# Patient Record
Sex: Female | Born: 1980
Health system: Southern US, Community
[De-identification: ages and names within clinical notes are randomized; demographics above are authoritative.]

## PROBLEM LIST (undated history)

## (undated) DIAGNOSIS — N809 Endometriosis, unspecified: Secondary | ICD-10-CM

## (undated) DIAGNOSIS — O09519 Supervision of elderly primigravida, unspecified trimester: Secondary | ICD-10-CM

## (undated) DIAGNOSIS — I10 Essential (primary) hypertension: Secondary | ICD-10-CM

## (undated) HISTORY — PX: APPENDECTOMY: SHX54

## (undated) HISTORY — DX: Endometriosis, unspecified: N80.9

## (undated) HISTORY — PX: WISDOM TOOTH EXTRACTION: SHX21

## (undated) HISTORY — PX: ABLATION ON ENDOMETRIOSIS: SHX5787

## (undated) HISTORY — PX: OTHER SURGICAL HISTORY: SHX169

## (undated) HISTORY — PX: ENDOMETRIAL ABLATION: SHX621

---

## 2011-10-25 DIAGNOSIS — G43009 Migraine without aura, not intractable, without status migrainosus: Secondary | ICD-10-CM | POA: Insufficient documentation

## 2011-10-25 DIAGNOSIS — I10 Essential (primary) hypertension: Secondary | ICD-10-CM | POA: Insufficient documentation

## 2011-10-25 DIAGNOSIS — K589 Irritable bowel syndrome without diarrhea: Secondary | ICD-10-CM | POA: Insufficient documentation

## 2013-07-11 NOTE — L&D Delivery Note (Signed)
Delivery Note At 1:19 PM a viable and healthy female was delivered via  (Presentation: LOA  ).  APGAR: 9, 9; weight pending.   Placenta status: spontaneous, intact.  Cord:  with the following complications: none.  Cord pH: na  Anesthesia:epidural   Episiotomy: none Lacerations: second Suture Repair: 2.0 vicryl rapide Est. Blood Loss (mL):   Mom to postpartum.  Baby to Couplet care / Skin to Skin.  Trinitey Roache J 04/23/2014, 1:34 PM

## 2013-09-20 LAB — OB RESULTS CONSOLE HIV ANTIBODY (ROUTINE TESTING): HIV: NONREACTIVE

## 2013-09-20 LAB — OB RESULTS CONSOLE ANTIBODY SCREEN: Antibody Screen: NEGATIVE

## 2013-09-20 LAB — OB RESULTS CONSOLE RPR: RPR: NONREACTIVE

## 2013-09-20 LAB — OB RESULTS CONSOLE RUBELLA ANTIBODY, IGM: Rubella: IMMUNE

## 2013-09-20 LAB — OB RESULTS CONSOLE HEPATITIS B SURFACE ANTIGEN: Hepatitis B Surface Ag: NEGATIVE

## 2013-09-20 LAB — OB RESULTS CONSOLE ABO/RH: RH Type: POSITIVE

## 2013-09-23 ENCOUNTER — Other Ambulatory Visit: Payer: Self-pay

## 2013-09-24 LAB — OB RESULTS CONSOLE GC/CHLAMYDIA
CHLAMYDIA, DNA PROBE: NEGATIVE
Gonorrhea: NEGATIVE

## 2013-11-25 ENCOUNTER — Other Ambulatory Visit (HOSPITAL_COMMUNITY): Payer: Self-pay | Admitting: Obstetrics & Gynecology

## 2013-11-25 DIAGNOSIS — O358XX0 Maternal care for other (suspected) fetal abnormality and damage, not applicable or unspecified: Secondary | ICD-10-CM

## 2013-11-27 ENCOUNTER — Ambulatory Visit (HOSPITAL_COMMUNITY)
Admission: RE | Admit: 2013-11-27 | Discharge: 2013-11-27 | Disposition: A | Discharge status: Home / Self Care | Payer: PRIVATE HEALTH INSURANCE | Source: Ambulatory Visit | Attending: Obstetrics & Gynecology | Admitting: Obstetrics & Gynecology

## 2013-11-27 ENCOUNTER — Other Ambulatory Visit (HOSPITAL_COMMUNITY): Payer: Self-pay | Admitting: Obstetrics & Gynecology

## 2013-11-27 ENCOUNTER — Other Ambulatory Visit: Payer: Self-pay

## 2013-11-27 ENCOUNTER — Encounter (HOSPITAL_COMMUNITY): Payer: Self-pay

## 2013-11-27 DIAGNOSIS — O352XX0 Maternal care for (suspected) hereditary disease in fetus, not applicable or unspecified: Secondary | ICD-10-CM | POA: Insufficient documentation

## 2013-11-27 DIAGNOSIS — O358XX0 Maternal care for other (suspected) fetal abnormality and damage, not applicable or unspecified: Secondary | ICD-10-CM

## 2013-11-27 DIAGNOSIS — IMO0002 Reserved for concepts with insufficient information to code with codable children: Secondary | ICD-10-CM | POA: Insufficient documentation

## 2013-11-27 DIAGNOSIS — O289 Unspecified abnormal findings on antenatal screening of mother: Secondary | ICD-10-CM

## 2013-11-27 HISTORY — DX: Essential (primary) hypertension: I10

## 2013-11-27 HISTORY — DX: Supervision of elderly primigravida, unspecified trimester: O09.519

## 2013-11-27 NOTE — Progress Notes (Signed)
Genetic Counseling  High-Risk Gestation Note  Appointment Date:  11/27/2013 Referred By: Anna Royals, MD Date of Birth:  February 02, 1981 Partner: Anna Washington   Pregnancy History: G1P0 Estimated Date of Delivery: 04/19/14 Estimated Gestational Age: [redacted]w[redacted]d Attending: Benjaman Lobe, MD   [name] and her husband, [name], were seen for genetic counseling regarding a concern for a fetal disorder of sex development.    [name] had noninvasive prenatal screening (NIPS)/cell free fetal DNA (cffDNA) testing, specifically InformaSeq testing, through [facility] because of a maternal age of 59 (37 at delivery).  This result revealed "no aneuploidy detected" for chromosomes 21, 13, and 18.  The fetal gender was predicted to be female, indicating that fetal Y chromosome material was detected.  A detailed anatomy ultrasound was performed at [facility] on Nov 25, 2013.  By ultrasound, the fetus appears to be female.  The patient was referred to the [facility] for a repeat ultrasound, consultation, and genetic counseling.  A complete ultrasound was performed today.  The report will be documented separately.   There were no visualized fetal anomalies or markers for aneuploidy.  The fetus appeared phenotypically female.  In reviewing the history, this pregnancy was conceived by IVF.  Two embryos were transferred.  [name] reported that a second embryonic second sac was still visualized at the time of her nuchal translucency ultrasound at [redacted]w[redacted]d gestation.   We reviewed that cell free fetal DNA is primarily derived from the placenta and that fragments of cell free fetal DNA are typically cleared quickly from maternal circulation.  However, given that a second embryonic sac was still visualized two weeks after the InformaSeq testing was performed, it is very likely that the discrepant results between genetic gender and ultrasound is due  to the presence of a second, female, embryonic sac.  We discussed that gender determination by NIPS is performed by looking for the presence or absence of Y chromosome material.  Considering that [name] is currently [redacted]w[redacted]d gestation, we discussed the option of repeating NIPS.  If the Y chromosome material was truly from the second embryonic sac, which is no longer visualized, those cells should have cleared from maternal circulation and the gender by NIPS should now be female.    We also discussed the option of amniocentesis including the risks, benefits, and limitations of this procedure.  They understand that cells derived from amniocentesis can be used for fetal chromosome analysis, including sex chromosome analysis and microarray analysis.  After thoughtful consideration of this option, this couple elected to proceed with a repeat NIPS (Panorama).  Results will be available in approximately 7 days.  Amniocentesis was declined.  In addition, we discussed the possibility of a fetal disorder of sex development (DSD).  This is a term that is used to replace previously used terms such as sex reversal, pseudohermaphroditism, and intersex.  They were counseled that female sexual determination is initiated by Y chromosomal SRY, which activates a cascade of genes that lead the embryonic gonad to develop into a testis.   Significant genetic heterogeneity is observed in DSD.  Mutations in X-linked (NR0B1), Y-linked (SRY) and autosomal genes (NR5A1, CBX2, MAP3K1, DHH, AKR1C2, AKR1C4, and ZFPM2) have been linked to 22,XY males having external female genitalia.  In addition, specific deletions of chromosome material from the X or Y chromosome as well as autosomal chromosomes can cause DSD.   Given the significant genetic heterogeneity, we discussed  that it is often difficult to determine a specific cause for a prenatally detected DSD.  We discussed the availability of a consultation with pediatric subspecialists including  medical genetics, if the Panorama test again shows the presence of Y chromosome material.  [name] was provided with written information regarding cystic fibrosis (CF) including the carrier frequency and incidence in the Caucasian population, the availability of carrier testing and prenatal diagnosis if indicated.  In addition, we discussed that CF is routinely screened for as part of the [place] newborn screening panel.  She reported that she had this screening previously, and that the results were negative for the common mutations analyzed.    In addition, [name] reported that her father has some Jewish ancestry.  She is uncertain if he is Ashkenazi [place] or Sephardic Jewish.  We discussed the availability of carrier screening for certain genetic conditions based on Jewish ancestry.  Genetic testing is available for some of the more common conditions, including Canavan disease, cystic fibrosis, Tay sachs disease, Gaucher disease, Mucolipidosis type IV,  Neimann-Pick type A, Fanconi anemia type C, Bloom syndrome, and familial dysautonomia.  All of these conditions are inherited in an autosomal recessive fashion, where both parents have to be carriers of the condition before a baby is at increased risk (1 in 4, or 25% risk) to inherit the disease. Carrier screening performed through a standard blood draw can reduce but not eliminate a person's chance to be a carrier for these conditions.  [name] declined Ashkenazi Jewish ancestry based carrier screening at the time of today's visit.   Both family histories were reviewed and found to be contributory for Anna Washington having a nephew who has congenital hearing loss of unknown etiology.  We reviewed that hearing loss can be environmental, multifactorial, or genetic.  Genetic causes can be subdivided into syndromic and nonsyndromic categories.  We reviewed that most nonsyndromic genetic hearing loss is inherited in an autosomal recessive manner.  We  reviewed recessive inheritance and associated risks of recurrence.  Without further information regarding the cause of this relative's hearing loss, an accurate risk assessment cannot be provided.  Further genetic counseling is warranted if additional information is learned.  We also discussed the availability of newborn screening for hearing loss.  I counseled this couple regarding the above risks and available options.  The approximate face-to-face time with the genetic counselor was 42 minutes.  Sharyne Richters, MS Certified Genetic Counselor

## 2013-12-04 ENCOUNTER — Telehealth (HOSPITAL_COMMUNITY): Payer: Self-pay

## 2013-12-04 NOTE — Telephone Encounter (Signed)
Called Anna Washington to discuss her cell free fetal DNA test results.  [name] had Panorama testing through [facility].  Testing was offered because the fetal sex was discrepant between InformaSeq results (performed at the referring provider's office) and ultrasound. The patient was identified by name and DOB.  We reviewed that these are within normal limits, showing a less than 1 in 10,000 risk for trisomies 21, 18 and 13, and monosomy X (Turner syndrome).  In addition, the risk for triploidy/vanishing twin and sex chromosome trisomies (47,XXX and 47,XXY) was also low risk.   We reviewed that this testing identifies > 99% of pregnancies with trisomy 105, trisomy 66, sex chromosome trisomies (47,XXX and 47,XXY), and triploidy. The detection rate for trisomy 18 is 96%.  The detection rate for monosomy X is ~92%.  The false positive rate is <0.1% for all conditions. Testing was also consistent with female fetal sex.  We discussed that this is consistent with her ultrasound images.  Discussed that the female fetal sex InformaSeq result was likely due to the presence of a second gestational sac at the time that the testing was performed. She understands that this testing does not identify all genetic conditions.  All questions were answered to her satisfaction, she was encouraged to call with additional questions or concerns.  Sharyne Richters, MS Certified Genetic Counselor

## 2013-12-23 ENCOUNTER — Inpatient Hospital Stay (HOSPITAL_COMMUNITY): Admission: AD | Admit: 2013-12-23 | Payer: Self-pay | Source: Ambulatory Visit | Admitting: Obstetrics & Gynecology

## 2014-04-03 LAB — OB RESULTS CONSOLE GBS: GBS: NEGATIVE

## 2014-04-16 ENCOUNTER — Telehealth (HOSPITAL_COMMUNITY): Payer: Self-pay | Admitting: *Deleted

## 2014-04-16 ENCOUNTER — Encounter (HOSPITAL_COMMUNITY): Payer: Self-pay | Admitting: *Deleted

## 2014-04-16 NOTE — Telephone Encounter (Signed)
Preadmission screen  

## 2014-04-22 ENCOUNTER — Other Ambulatory Visit: Payer: PRIVATE HEALTH INSURANCE | Admitting: Obstetrics and Gynecology

## 2014-04-22 ENCOUNTER — Encounter (HOSPITAL_COMMUNITY): Payer: Self-pay

## 2014-04-22 ENCOUNTER — Inpatient Hospital Stay (HOSPITAL_COMMUNITY)
Admission: RE | Admit: 2014-04-22 | Discharge: 2014-04-26 | DRG: 768 | Disposition: A | Discharge status: Home / Self Care | Payer: PRIVATE HEALTH INSURANCE | Source: Ambulatory Visit | Attending: Obstetrics and Gynecology | Admitting: Obstetrics and Gynecology

## 2014-04-22 DIAGNOSIS — O09513 Supervision of elderly primigravida, third trimester: Secondary | ICD-10-CM | POA: Diagnosis not present

## 2014-04-22 DIAGNOSIS — O4100X0 Oligohydramnios, unspecified trimester, not applicable or unspecified: Secondary | ICD-10-CM | POA: Diagnosis present

## 2014-04-22 DIAGNOSIS — Z3A40 40 weeks gestation of pregnancy: Secondary | ICD-10-CM | POA: Diagnosis present

## 2014-04-22 DIAGNOSIS — Z8249 Family history of ischemic heart disease and other diseases of the circulatory system: Secondary | ICD-10-CM

## 2014-04-22 DIAGNOSIS — IMO0001 Reserved for inherently not codable concepts without codable children: Secondary | ICD-10-CM

## 2014-04-22 DIAGNOSIS — O480 Post-term pregnancy: Principal | ICD-10-CM | POA: Diagnosis present

## 2014-04-22 DIAGNOSIS — O1002 Pre-existing essential hypertension complicating childbirth: Secondary | ICD-10-CM | POA: Diagnosis present

## 2014-04-22 DIAGNOSIS — O9902 Anemia complicating childbirth: Secondary | ICD-10-CM | POA: Diagnosis present

## 2014-04-22 DIAGNOSIS — O4103X0 Oligohydramnios, third trimester, not applicable or unspecified: Secondary | ICD-10-CM | POA: Diagnosis present

## 2014-04-22 DIAGNOSIS — D62 Acute posthemorrhagic anemia: Secondary | ICD-10-CM | POA: Diagnosis present

## 2014-04-22 DIAGNOSIS — Z833 Family history of diabetes mellitus: Secondary | ICD-10-CM | POA: Diagnosis not present

## 2014-04-22 DIAGNOSIS — O119 Pre-existing hypertension with pre-eclampsia, unspecified trimester: Secondary | ICD-10-CM | POA: Diagnosis present

## 2014-04-22 DIAGNOSIS — N939 Abnormal uterine and vaginal bleeding, unspecified: Secondary | ICD-10-CM

## 2014-04-22 MED ORDER — OXYCODONE-ACETAMINOPHEN 5-325 MG PO TABS: 2.0000 | ORAL_TABLET | ORAL | Status: DC | PRN

## 2014-04-22 MED ORDER — CITRIC ACID-SODIUM CITRATE 334-500 MG/5ML PO SOLN: 30.0000 mL | ORAL | Status: DC | PRN

## 2014-04-22 MED ORDER — ONDANSETRON HCL 4 MG/2ML IJ SOLN: 4.0000 mg | Freq: Four times a day (QID) | INTRAMUSCULAR | Status: DC | PRN

## 2014-04-22 MED ORDER — OXYTOCIN 40 UNITS IN LACTATED RINGERS INFUSION - SIMPLE MED
62.5000 mL/h | INTRAVENOUS | Status: DC
  Administered 2014-04-23: 125 mL/h via INTRAVENOUS
  Filled 2014-04-22 (×2): qty 1000

## 2014-04-22 MED ORDER — OXYTOCIN 40 UNITS IN LACTATED RINGERS INFUSION - SIMPLE MED
1.0000 m[IU]/min | INTRAVENOUS | Status: DC
  Administered 2014-04-23: 150 mL/h via INTRAVENOUS

## 2014-04-22 MED ORDER — LIDOCAINE HCL (PF) 1 % IJ SOLN
30.0000 mL | INTRAMUSCULAR | Status: DC | PRN
  Filled 2014-04-22: qty 30

## 2014-04-22 MED ORDER — OXYCODONE-ACETAMINOPHEN 5-325 MG PO TABS: 1.0000 | ORAL_TABLET | ORAL | Status: DC | PRN

## 2014-04-22 MED ORDER — ACETAMINOPHEN 325 MG PO TABS
650.0000 mg | ORAL_TABLET | ORAL | Status: DC | PRN
  Filled 2014-04-22 (×2): qty 2

## 2014-04-22 MED ORDER — LACTATED RINGERS IV SOLN
500.0000 mL | INTRAVENOUS | Status: DC | PRN
  Administered 2014-04-23: 1000 mL via INTRAVENOUS

## 2014-04-22 MED ORDER — TERBUTALINE SULFATE 1 MG/ML IJ SOLN: 0.2500 mg | Freq: Once | INTRAMUSCULAR | Status: AC | PRN

## 2014-04-22 MED ORDER — MISOPROSTOL 25 MCG QUARTER TABLET
25.0000 ug | ORAL_TABLET | ORAL | Status: DC | PRN
  Administered 2014-04-22 – 2014-04-23 (×2): 25 ug via VAGINAL
  Filled 2014-04-22 (×2): qty 0.25

## 2014-04-22 MED ORDER — LACTATED RINGERS IV SOLN
INTRAVENOUS | Status: DC
  Administered 2014-04-22 – 2014-04-23 (×3): via INTRAVENOUS
  Administered 2014-04-23: 1000 mL via INTRAVENOUS
  Administered 2014-04-23: 17:00:00 via INTRAVENOUS

## 2014-04-22 MED ORDER — OXYTOCIN BOLUS FROM INFUSION
500.0000 mL | INTRAVENOUS | Status: DC
  Administered 2014-04-23: 500 mL via INTRAVENOUS

## 2014-04-22 MED ORDER — FLEET ENEMA 7-19 GM/118ML RE ENEM: 1.0000 | ENEMA | RECTAL | Status: DC | PRN

## 2014-04-22 MED ORDER — ZOLPIDEM TARTRATE 5 MG PO TABS: 5.0000 mg | ORAL_TABLET | Freq: Every evening | ORAL | Status: DC | PRN

## 2014-04-22 NOTE — H&P (Signed)
Anna Washington is a 33 y.o. female presenting for IOL for postterm, CHTN and oligo.  Maternal Medical History:  Contractions: Onset was less than 1 hour ago.   Frequency: irregular.   Perceived severity is mild.    Fetal activity: Perceived fetal activity is normal.   Last perceived fetal movement was within the past hour.    Prenatal complications: PIH and oligohydramnios.   Prenatal Complications - Diabetes: none.    OB History   Grav Para Term Preterm Abortions TAB SAB Ect Mult Living   1              Past Medical History  Diagnosis Date  . Hypertension   . Newborn product of IVF pregnancy   . AMA (advanced maternal age) primigravida 69+   . Endometriosis    No past surgical history on file. Family History: family history includes Cancer in her maternal grandmother and paternal grandfather; Diabetes in her paternal grandfather and paternal grandmother; Heart attack in her maternal grandfather. Social History:  has no tobacco, alcohol, and drug history on file.   Prenatal Transfer Tool  Maternal Diabetes: No Genetic Screening: Normal Maternal Ultrasounds/Referrals: Normal Fetal Ultrasounds or other Referrals:  None Maternal Substance Abuse:  No Significant Maternal Medications:  None Significant Maternal Lab Results:  None Other Comments:  CHTN on no meds, Oligo  Review of Systems  Constitutional: Negative.   Respiratory: Negative.   Gastrointestinal: Negative.   Genitourinary: Negative.   Neurological: Negative.   All other systems reviewed and are negative.     Last menstrual period 07/07/2013. Maternal Exam:  Uterine Assessment: Contraction strength is mild.  Contraction frequency is rare.   Abdomen: Patient reports no abdominal tenderness. Fetal presentation: vertex  Introitus: Normal vulva. Normal vagina.  Pelvis: adequate for delivery.   Cervix: Cervix evaluated by digital exam.     Physical Exam  Constitutional: She is oriented to person, place,  and time. She appears well-developed and well-nourished.  HENT:  Head: Normocephalic and atraumatic.  Neck: Normal range of motion. Neck supple.  Cardiovascular: Normal rate and regular rhythm.   Respiratory: Effort normal and breath sounds normal.  GI: Soft. Bowel sounds are normal.  Genitourinary: Vagina normal and uterus normal.  Musculoskeletal: Normal range of motion.  Neurological: She is alert and oriented to person, place, and time. She has normal reflexes.  Skin: Skin is warm.  Psychiatric: She has a normal mood and affect.    Prenatal labs: ABO, Rh: O/Positive/-- (03/13 0000) Antibody: Negative (03/13 0000) Rubella: Immune (03/13 0000) RPR: Nonreactive (03/13 0000)  HBsAg: Negative (03/13 0000)  HIV: Non-reactive (03/13 0000)  GBS: Negative (09/24 0000)   Assessment/Plan: 40+ weeks CHTN on no meds New onset Oligo Cytotec, Pitocin Epidural prn   Cherise Fedder J 04/22/2014, 11:20 PM

## 2014-04-23 ENCOUNTER — Inpatient Hospital Stay (HOSPITAL_COMMUNITY): Payer: PRIVATE HEALTH INSURANCE | Admitting: Anesthesiology

## 2014-04-23 ENCOUNTER — Encounter (HOSPITAL_COMMUNITY): Payer: Self-pay

## 2014-04-23 ENCOUNTER — Encounter (HOSPITAL_COMMUNITY)
Admission: RE | Disposition: A | Discharge status: Home / Self Care | Payer: Self-pay | Source: Ambulatory Visit | Attending: Obstetrics and Gynecology

## 2014-04-23 ENCOUNTER — Encounter (HOSPITAL_COMMUNITY): Payer: PRIVATE HEALTH INSURANCE | Admitting: Anesthesiology

## 2014-04-23 ENCOUNTER — Inpatient Hospital Stay (HOSPITAL_COMMUNITY): Payer: PRIVATE HEALTH INSURANCE

## 2014-04-23 HISTORY — PX: DILATION AND EVACUATION: SHX1459

## 2014-04-23 HISTORY — PX: EXAMINATION UNDER ANESTHESIA: SHX1540

## 2014-04-23 LAB — CBC
HCT: 35.6 % — ABNORMAL LOW (ref 36.0–46.0)
HCT: 35.7 % — ABNORMAL LOW (ref 36.0–46.0)
HCT: 36.3 % (ref 36.0–46.0)
HEMATOCRIT: 36.8 % (ref 36.0–46.0)
HEMOGLOBIN: 12.4 g/dL (ref 12.0–15.0)
HEMOGLOBIN: 12.8 g/dL (ref 12.0–15.0)
Hemoglobin: 12.4 g/dL (ref 12.0–15.0)
Hemoglobin: 12.9 g/dL (ref 12.0–15.0)
MCH: 31.1 pg (ref 26.0–34.0)
MCH: 32.1 pg (ref 26.0–34.0)
MCH: 32.3 pg (ref 26.0–34.0)
MCH: 32.8 pg (ref 26.0–34.0)
MCHC: 34.7 g/dL (ref 30.0–36.0)
MCHC: 34.8 g/dL (ref 30.0–36.0)
MCHC: 35.1 g/dL (ref 30.0–36.0)
MCHC: 35.3 g/dL (ref 30.0–36.0)
MCV: 88.7 fL (ref 78.0–100.0)
MCV: 92.5 fL (ref 78.0–100.0)
MCV: 92.7 fL (ref 78.0–100.0)
MCV: 93.1 fL (ref 78.0–100.0)
PLATELETS: 117 10*3/uL — AB (ref 150–400)
PLATELETS: 186 10*3/uL (ref 150–400)
Platelets: 165 10*3/uL (ref 150–400)
Platelets: 156 10*3/uL (ref 150–400)
RBC: 3.84 MIL/uL — ABNORMAL LOW (ref 3.87–5.11)
RBC: 3.86 MIL/uL — ABNORMAL LOW (ref 3.87–5.11)
RBC: 3.9 MIL/uL (ref 3.87–5.11)
RBC: 4.15 MIL/uL (ref 3.87–5.11)
RDW: 12.8 % (ref 11.5–15.5)
RDW: 12.8 % (ref 11.5–15.5)
RDW: 12.9 % (ref 11.5–15.5)
RDW: 14.3 % (ref 11.5–15.5)
WBC: 11.2 10*3/uL — ABNORMAL HIGH (ref 4.0–10.5)
WBC: 11.9 10*3/uL — ABNORMAL HIGH (ref 4.0–10.5)
WBC: 13.9 10*3/uL — AB (ref 4.0–10.5)
WBC: 9.3 10*3/uL (ref 4.0–10.5)

## 2014-04-23 LAB — COMPREHENSIVE METABOLIC PANEL
ALBUMIN: 2.8 g/dL — AB (ref 3.5–5.2)
ALBUMIN: 2.9 g/dL — AB (ref 3.5–5.2)
ALK PHOS: 101 U/L (ref 39–117)
ALK PHOS: 101 U/L (ref 39–117)
ALT: 16 U/L (ref 0–35)
ALT: 16 U/L (ref 0–35)
ANION GAP: 11 (ref 5–15)
AST: 25 U/L (ref 0–37)
AST: 26 U/L (ref 0–37)
Anion gap: 13 (ref 5–15)
BUN: 12 mg/dL (ref 6–23)
BUN: 8 mg/dL (ref 6–23)
CALCIUM: 8.7 mg/dL (ref 8.4–10.5)
CO2: 19 mEq/L (ref 19–32)
CO2: 22 mEq/L (ref 19–32)
Calcium: 8.2 mg/dL — ABNORMAL LOW (ref 8.4–10.5)
Chloride: 101 mEq/L (ref 96–112)
Chloride: 103 mEq/L (ref 96–112)
Creatinine, Ser: 0.63 mg/dL (ref 0.50–1.10)
Creatinine, Ser: 0.63 mg/dL (ref 0.50–1.10)
GFR calc Af Amer: >90 mL/min (ref >90)
GFR calc Af Amer: >90 mL/min (ref >90)
GFR calc non Af Amer: >90 mL/min (ref >90)
GFR calc non Af Amer: >90 mL/min (ref >90)
Glucose, Bld: 88 mg/dL (ref 70–99)
Glucose, Bld: 93 mg/dL (ref 70–99)
POTASSIUM: 3.8 meq/L (ref 3.7–5.3)
Potassium: 4.2 mEq/L (ref 3.7–5.3)
Sodium: 134 mEq/L — ABNORMAL LOW (ref 137–147)
Sodium: 135 mEq/L — ABNORMAL LOW (ref 137–147)
Total Bilirubin: 0.3 mg/dL (ref 0.3–1.2)
Total Bilirubin: <0.2 mg/dL — ABNORMAL LOW (ref 0.3–1.2)
Total Protein: 6 g/dL (ref 6.0–8.3)
Total Protein: 6 g/dL (ref 6.0–8.3)

## 2014-04-23 LAB — LACTATE DEHYDROGENASE: LDH: 186 U/L (ref 94–250)

## 2014-04-23 LAB — PROTIME-INR
INR: 1.23 (ref 0.00–1.49)
Prothrombin Time: 15.7 seconds — ABNORMAL HIGH (ref 11.6–15.2)

## 2014-04-23 LAB — PROTEIN / CREATININE RATIO, URINE
Creatinine, Urine: 70.21 mg/dL
PROTEIN CREATININE RATIO: 0.44 — AB (ref 0.00–0.15)
TOTAL PROTEIN, URINE: 30.9 mg/dL

## 2014-04-23 LAB — APTT: aPTT: 29 seconds (ref 24–37)

## 2014-04-23 LAB — PREPARE RBC (CROSSMATCH)

## 2014-04-23 LAB — RPR

## 2014-04-23 LAB — URIC ACID: Uric Acid, Serum: 4.7 mg/dL (ref 2.4–7.0)

## 2014-04-23 LAB — FIBRINOGEN: Fibrinogen: 167 mg/dL — ABNORMAL LOW (ref 204–475)

## 2014-04-23 LAB — ABO/RH: ABO/RH(D): O POS

## 2014-04-23 SURGERY — EXAM UNDER ANESTHESIA
Anesthesia: Epidural | Site: Vagina

## 2014-04-23 MED ORDER — SODIUM CHLORIDE 0.9 % IV SOLN: Freq: Once | INTRAVENOUS | Status: DC

## 2014-04-23 MED ORDER — FENTANYL CITRATE 0.05 MG/ML IJ SOLN
INTRAMUSCULAR | Status: AC
  Filled 2014-04-23: qty 2

## 2014-04-23 MED ORDER — MAGNESIUM SULFATE 40 G IN LACTATED RINGERS - SIMPLE
2.0000 g/h | INTRAVENOUS | Status: DC
  Filled 2014-04-23: qty 500

## 2014-04-23 MED ORDER — SODIUM BICARBONATE 8.4 % IV SOLN
INTRAVENOUS | Status: DC | PRN
  Administered 2014-04-23 (×3): 5 mL via EPIDURAL

## 2014-04-23 MED ORDER — MORPHINE SULFATE 0.5 MG/ML IJ SOLN
INTRAMUSCULAR | Status: AC
  Filled 2014-04-23: qty 10

## 2014-04-23 MED ORDER — DIPHENHYDRAMINE HCL 50 MG/ML IJ SOLN: 25.0000 mg | Freq: Once | INTRAMUSCULAR | Status: DC

## 2014-04-23 MED ORDER — MORPHINE SULFATE (PF) 0.5 MG/ML IJ SOLN
INTRAMUSCULAR | Status: DC | PRN
  Administered 2014-04-23: 1 mg via INTRAVENOUS
  Administered 2014-04-23: 4 mg via EPIDURAL

## 2014-04-23 MED ORDER — SODIUM BICARBONATE 8.4 % IV SOLN
INTRAVENOUS | Status: AC
  Filled 2014-04-23: qty 50

## 2014-04-23 MED ORDER — FENTANYL CITRATE 0.05 MG/ML IJ SOLN: 25.0000 ug | INTRAMUSCULAR | Status: DC | PRN

## 2014-04-23 MED ORDER — PHENYLEPHRINE 40 MCG/ML (10ML) SYRINGE FOR IV PUSH (FOR BLOOD PRESSURE SUPPORT)
PREFILLED_SYRINGE | INTRAVENOUS | Status: AC
  Filled 2014-04-23: qty 10

## 2014-04-23 MED ORDER — MEPERIDINE HCL 25 MG/ML IJ SOLN: 6.2500 mg | INTRAMUSCULAR | Status: DC | PRN

## 2014-04-23 MED ORDER — OXYTOCIN 40 UNITS IN LACTATED RINGERS INFUSION - SIMPLE MED
100.0000 mL/h | INTRAVENOUS | Status: DC
  Administered 2014-04-23 – 2014-04-24 (×3): 100 mL/h via INTRAVENOUS
  Filled 2014-04-23 (×2): qty 1000

## 2014-04-23 MED ORDER — CARBOPROST TROMETHAMINE 250 MCG/ML IM SOLN: 250.0000 ug | INTRAMUSCULAR | Status: DC | PRN

## 2014-04-23 MED ORDER — MISOPROSTOL 200 MCG PO TABS
ORAL_TABLET | ORAL | Status: AC
Start: 2014-04-23 — End: 2014-04-24
  Filled 2014-04-23: qty 1

## 2014-04-23 MED ORDER — EPHEDRINE 5 MG/ML INJ: 10.0000 mg | INTRAVENOUS | Status: DC | PRN

## 2014-04-23 MED ORDER — LABETALOL HCL 5 MG/ML IV SOLN
20.0000 mg | Freq: Once | INTRAVENOUS | Status: AC
  Administered 2014-04-23: 20 mg via INTRAVENOUS
  Filled 2014-04-23: qty 4

## 2014-04-23 MED ORDER — PHENYLEPHRINE HCL 10 MG/ML IJ SOLN
INTRAMUSCULAR | Status: DC | PRN
  Administered 2014-04-23: 20 ug via INTRAVENOUS
  Administered 2014-04-23 (×2): 40 ug via INTRAVENOUS

## 2014-04-23 MED ORDER — FENTANYL 2.5 MCG/ML BUPIVACAINE 1/10 % EPIDURAL INFUSION (WH - ANES)
14.0000 mL/h | INTRAMUSCULAR | Status: DC | PRN
  Administered 2014-04-23: 14 mL/h via EPIDURAL

## 2014-04-23 MED ORDER — PHENYLEPHRINE 40 MCG/ML (10ML) SYRINGE FOR IV PUSH (FOR BLOOD PRESSURE SUPPORT)
80.0000 ug | PREFILLED_SYRINGE | INTRAVENOUS | Status: DC | PRN
  Filled 2014-04-23: qty 10

## 2014-04-23 MED ORDER — LIDOCAINE-EPINEPHRINE (PF) 2 %-1:200000 IJ SOLN
INTRAMUSCULAR | Status: AC
  Filled 2014-04-23: qty 20

## 2014-04-23 MED ORDER — CEFAZOLIN SODIUM-DEXTROSE 2-3 GM-% IV SOLR
INTRAVENOUS | Status: DC | PRN
  Administered 2014-04-23: 2 g via INTRAVENOUS

## 2014-04-23 MED ORDER — ACETAMINOPHEN 650 MG RE SUPP
650.0000 mg | RECTAL | Status: DC | PRN
  Filled 2014-04-23: qty 1

## 2014-04-23 MED ORDER — MIDAZOLAM HCL 2 MG/2ML IJ SOLN: 0.5000 mg | Freq: Once | INTRAMUSCULAR | Status: DC | PRN

## 2014-04-23 MED ORDER — DIPHENHYDRAMINE HCL 50 MG/ML IJ SOLN
12.5000 mg | INTRAMUSCULAR | Status: DC | PRN
  Filled 2014-04-23: qty 1

## 2014-04-23 MED ORDER — MAGNESIUM SULFATE BOLUS VIA INFUSION
4.0000 g | Freq: Once | INTRAVENOUS | Status: DC
  Filled 2014-04-23: qty 500

## 2014-04-23 MED ORDER — SODIUM CHLORIDE 0.9 % IV SOLN
INTRAVENOUS | Status: DC | PRN
  Administered 2014-04-23 (×2): via INTRAVENOUS

## 2014-04-23 MED ORDER — LACTATED RINGERS IV SOLN: 500.0000 mL | Freq: Once | INTRAVENOUS | Status: DC

## 2014-04-23 MED ORDER — PROMETHAZINE HCL 25 MG/ML IJ SOLN: 6.2500 mg | INTRAMUSCULAR | Status: DC | PRN

## 2014-04-23 MED ORDER — FENTANYL 2.5 MCG/ML BUPIVACAINE 1/10 % EPIDURAL INFUSION (WH - ANES)
INTRAMUSCULAR | Status: AC
  Administered 2014-04-23: 09:00:00
  Filled 2014-04-23: qty 125

## 2014-04-23 MED ORDER — LIDOCAINE HCL (PF) 1 % IJ SOLN
INTRAMUSCULAR | Status: DC | PRN
  Administered 2014-04-23 (×4): 4 mL

## 2014-04-23 MED ORDER — MIDAZOLAM HCL 2 MG/2ML IJ SOLN
INTRAMUSCULAR | Status: AC
  Filled 2014-04-23: qty 2

## 2014-04-23 MED ORDER — CEFOTETAN DISODIUM 2 G IJ SOLR
2.0000 g | Freq: Two times a day (BID) | INTRAMUSCULAR | Status: DC
  Administered 2014-04-23 – 2014-04-24 (×2): 2 g via INTRAVENOUS
  Filled 2014-04-23 (×3): qty 2

## 2014-04-23 MED ORDER — PHENYLEPHRINE 40 MCG/ML (10ML) SYRINGE FOR IV PUSH (FOR BLOOD PRESSURE SUPPORT)
80.0000 ug | PREFILLED_SYRINGE | INTRAVENOUS | Status: DC | PRN
  Administered 2014-04-23: 80 ug via INTRAVENOUS

## 2014-04-23 MED ORDER — MISOPROSTOL 200 MCG PO TABS
ORAL_TABLET | ORAL | Status: AC
  Filled 2014-04-23: qty 4

## 2014-04-23 MED ORDER — MISOPROSTOL 200 MCG PO TABS
800.0000 ug | ORAL_TABLET | Freq: Once | ORAL | Status: AC
  Administered 2014-04-23: 800 ug via RECTAL

## 2014-04-23 MED ORDER — NITROGLYCERIN 0.4 MG/SPRAY TL SOLN
Status: AC
  Filled 2014-04-23: qty 4.9

## 2014-04-23 SURGICAL SUPPLY — 20 items
ADAPTER VACURETTE TBG SET 14 (CANNULA) ×2 IMPLANT
BALLN POSTPARTUM SOS BAKRI (BALLOONS) ×2
BALLOON POSTPARTUM SOS BAKRI (BALLOONS) ×1 IMPLANT
CATH ROBINSON RED A/P 16FR (CATHETERS) ×2 IMPLANT
CLOTH BEACON ORANGE TIMEOUT ST (SAFETY) ×2 IMPLANT
GAUZE PACKING 2X5 YD STRL (GAUZE/BANDAGES/DRESSINGS) ×2 IMPLANT
GLOVE BIO SURGEON STRL SZ7.5 (GLOVE) ×2 IMPLANT
GOWN STRL REUS W/TWL LRG LVL3 (GOWN DISPOSABLE) ×4 IMPLANT
KIT BERKELEY 1ST TRIMESTER 3/8 (MISCELLANEOUS) ×2 IMPLANT
NS IRRIG 1000ML POUR BTL (IV SOLUTION) ×2 IMPLANT
PACK VAGINAL MINOR WOMEN LF (CUSTOM PROCEDURE TRAY) ×2 IMPLANT
PAD OB MATERNITY 4.3X12.25 (PERSONAL CARE ITEMS) ×2 IMPLANT
PAD PREP 24X48 CUFFED NSTRL (MISCELLANEOUS) ×2 IMPLANT
SET BERKELEY SUCTION TUBING (SUCTIONS) ×2 IMPLANT
SPONGE LAP 18X18 X RAY DECT (DISPOSABLE) ×2 IMPLANT
SUT VIC AB 2-0 CT1 27 (SUTURE) ×1
SUT VIC AB 2-0 CT1 TAPERPNT 27 (SUTURE) ×1 IMPLANT
TOWEL OR 17X24 6PK STRL BLUE (TOWEL DISPOSABLE) ×4 IMPLANT
TRAY FOLEY CATH 14FR (SET/KITS/TRAYS/PACK) ×2 IMPLANT
VACURETTE 14MM CVD 1/2 BASE (CANNULA) ×2 IMPLANT

## 2014-04-23 NOTE — Progress Notes (Signed)
Requested by Dr. Ronita Hipps to evaluate pt for excess bleeding. EBL per RN 750 ml.   S: Feels well, slight dizziness O:  H/H: 12.4/35.6 VSS-BP 130/79 HR 58 Alert and oriented x3 GU: FU @u /3 with mod large bright red lochia during massage. Perineal lac well approximated and hemostatic, left labial lac hemostatic. Bimanual-lower uterine segment boggy. No clots noted. Cytotec 800 mcg placed PR.  A/P:  lower segment atony S/p Cytotec Monitor bleeding Dr. Ronita Hipps updated with status, agree with plan.

## 2014-04-23 NOTE — Plan of Care (Signed)
Problem: Phase II Progression Outcomes Goal: Fundus firm and non-displaced Outcome: Progressing Fundus boggy firm with massage, loss of 1913ml blood during recovery, foley placed PPH protocol started. Stage 1, 2

## 2014-04-23 NOTE — Progress Notes (Signed)
Patient ID: Anna Washington, female   DOB: 20-May-1981, 33 y.o.   MRN: [id number] Came to evaluate pt after PPH. Seen by CNM. Seen by TS MD. Pt has received IV pitocin and 843mcg cytotec rectally. Pt stable upon my arrival.  VSS - afebrile Epidural dosed for examination and reassessment. Minimal bleeding on pad noted.  VE: Evacuated approximately 300cc form LUS. ? Small placental fragment noted. Rpt exam reveals no cervical or vaginal lacerations and minimal bleeding.  Manual exploration of uterus shows no evidence of retained placenta. Intermittent uterine atony noted. Due to EBL approx 1500-2000 cc and need to  Proceed with MgSO4 due to [facility] with severe features, Decision made to place Bakri balloon. Balloon placed without difficulty with ring forceps and confirmed by exam. 240cc placed in balloon. Pt tolerated procedure well. Vaginal packing placed. No bleeding noted. Uterus firm.  IMP: PPH  Plan: Will administer abx Transfer to ICU Will ck CBC and coags one hr s/p transfusion Will need MgSO4 for PEC with severe features. DC balloon in 24 hrs.

## 2014-04-23 NOTE — Anesthesia Preprocedure Evaluation (Signed)
Anesthesia Evaluation  Patient identified by MRN, date of birth, ID band Patient awake    Reviewed: Allergy & Precautions, H&P , NPO status , Patient's Chart, lab work & pertinent test results, reviewed documented beta blocker date and time   History of Anesthesia Complications Negative for: history of anesthetic complications  Airway Mallampati: III TM Distance: >3 FB Neck ROM: full    Dental  (+) Teeth Intact   Pulmonary neg pulmonary ROS,  breath sounds clear to auscultation        Cardiovascular hypertension (GHTN), Rhythm:regular Rate:Normal     Neuro/Psych negative neurological ROS  negative psych ROS   GI/Hepatic negative GI ROS, Neg liver ROS,   Endo/Other  negative endocrine ROS  Renal/GU negative Renal ROS     Musculoskeletal   Abdominal   Peds  Hematology negative hematology ROS (+)   Anesthesia Other Findings   Reproductive/Obstetrics (+) Pregnancy                           Anesthesia Physical Anesthesia Plan  ASA: II  Anesthesia Plan: Epidural   Post-op Pain Management:    Induction:   Airway Management Planned:   Additional Equipment:   Intra-op Plan:   Post-operative Plan:   Informed Consent: I have reviewed the patients History and Physical, chart, labs and discussed the procedure including the risks, benefits and alternatives for the proposed anesthesia with the patient or authorized representative who has indicated his/her understanding and acceptance.     Plan Discussed with:   Anesthesia Plan Comments:         Anesthesia Quick Evaluation

## 2014-04-23 NOTE — Progress Notes (Signed)
This note also relates to the following rows which could not be included: Pulse Rate - Cannot attach notes to unvalidated device data SpO2 - Cannot attach notes to unvalidated device data   Dr Ronita Hipps removed bakri ballon out with lg amt of clots following. First bag of RBC's running running. Out of room to O.R.

## 2014-04-23 NOTE — Anesthesia Postprocedure Evaluation (Signed)
  Anesthesia Post Note  Patient: Anna Washington  Procedure(s) Performed: Procedure(s) (LRB): EXAM UNDER ANESTHESIA placement of Bakri Balloon (N/A) DILATATION AND EVACUATION (N/A)  Anesthesia type: Epidural  Patient location: PACU  Post pain: Pain level controlled  Post assessment: Post-op Vital signs reviewed  Last Vitals:  Filed Vitals:   04/23/14 1845  BP:   Pulse:   Temp:   Resp: 12    Post vital signs: Reviewed  Level of consciousness: awake  Complications: No apparent anesthesia complications

## 2014-04-23 NOTE — Progress Notes (Signed)
This note also relates to the following rows which could not be included: BP - Cannot attach notes to unvalidated device data Pulse Rate - Cannot attach notes to unvalidated device data SpO2 - Cannot attach notes to unvalidated device data   Benadryl given pt febrile

## 2014-04-23 NOTE — Plan of Care (Signed)
Problem: Phase I Progression Outcomes Goal: Assess per MD/Nurse,Routine-VS,FHR,UC,Head to Toe assess Outcome: Progressing Pt had a HA earlier gone now, pt understands to report any other symptoms,discussed with pt symptoms that may ocurr

## 2014-04-23 NOTE — Plan of Care (Signed)
Problem: Phase I Progression Outcomes Goal: Assess per MD/Nurse,Routine-VS,FHR,UC,Head to Toe assess Outcome: Progressing Discussed with pt longterm complications with HTN, S/S to look for.

## 2014-04-23 NOTE — Progress Notes (Signed)
Taavon in room

## 2014-04-23 NOTE — Transfer of Care (Signed)
Immediate Anesthesia Transfer of Care Note  Patient: Anna Washington  Procedure(s) Performed: Procedure(s): EXAM UNDER ANESTHESIA placement of Bakri Balloon (N/A) DILATATION AND EVACUATION (N/A)  Patient Location: PACU  Anesthesia Type:Epidural  Level of Consciousness: awake, alert  and oriented  Airway & Oxygen Therapy: Patient Spontanous Breathing and Patient connected to nasal cannula oxygen  Post-op Assessment: Report given to PACU RN and Post -op Vital signs reviewed and stable  Post vital signs: Reviewed and stable  Complications: No apparent anesthesia complications

## 2014-04-23 NOTE — Progress Notes (Signed)
500 bolus of LR

## 2014-04-23 NOTE — Progress Notes (Signed)
Anna Washington is a 33 y.o. G1P0 at [redacted]w[redacted]d by LMP admitted for induction of labor due to Low amniotic fluid. and Hypertension.  Subjective: Getting comfortable. No s/s PEC  Objective: BP 169/97  Pulse 58  Temp(Src) 98.4 F (36.9 C) (Oral)  Resp 18  Ht 5\' 7"  (1.702 m)  Wt 64.411 kg (142 lb)  BMI 22.24 kg/m2  SpO2 99%  LMP 07/07/2013      FHT:  FHR: 145 bpm, variability: moderate,  accelerations:  Present,  decelerations:  Absent UC:   regular, every 2 minutes SVE:   Dilation: 2 Effacement (%): Thick Station: 0 Exam by:: Jode Lippe  Labs: Lab Results  Component Value Date   WBC 11.2* 04/23/2014   HGB 12.8 04/23/2014   HCT 36.3 04/23/2014   MCV 93.1 04/23/2014   PLT 165 04/23/2014    Assessment / Plan: PEC - mild, labile BP Oligo Postterm  Labor: Progressing normally Preeclampsia:  no signs or symptoms of toxicity, intake and ouput balanced and labs stable Fetal Wellbeing:  Category I Pain Control:  Epidural I/D:  n/a Anticipated MOD:  Monitor approach closely Will watch BP and fetal status closely Will not start Pitocin at this time  Mika Griffitts J 04/23/2014, 9:44 AM

## 2014-04-23 NOTE — Progress Notes (Signed)
Anna Washington is a 33 y.o. G1P0 at [redacted]w[redacted]d by LMP admitted for induction of labor due to Low amniotic fluid. and Pre-eclamptic toxemia of pregnancy..  Subjective: Getting Uncomfortable. No headaches or vision changes NO epigastric pain   Objective: BP 164/93  Pulse 54  Temp(Src) 98.4 F (36.9 C) (Oral)  Resp 18  Ht 5\' 7"  (1.702 m)  Wt 64.411 kg (142 lb)  BMI 22.24 kg/m2  LMP 07/07/2013      FHT:  FHR: 150 bpm, variability: moderate,  accelerations:  Present,  decelerations:  Absent UC:   irregular, every 3-5 minutes SVE:   2-3/60/0  Labs: Lab Results  Component Value Date   WBC 9.3 04/22/2014   HGB 12.4 04/22/2014   HCT 35.7* 04/22/2014   MCV 92.5 04/22/2014   PLT 186 04/22/2014    Assessment / Plan: 40+ weeks PEC without severe features, BP labile Oligohydramnios  Labor: Progressing normally Preeclampsia:  no signs or symptoms of toxicity and labs stable Fetal Wellbeing:  Category I Pain Control:  Labor support without medications I/D:  n/a Anticipated MOD:  CAutious approach to VD Will rpt labs and obtain epidural Reassess need for Pitocin  Chrisma Hurlock J 04/23/2014, 7:47 AM

## 2014-04-23 NOTE — Progress Notes (Signed)
ANAPAOLA KINSEL is a 33 y.o. G1P0 at [redacted]w[redacted]d by LMP admitted for induction of labor due to Low amniotic fluid. and Pre-eclamptic toxemia of pregnancy..  Subjective: Feels pressure  Objective: BP 157/87  Pulse 59  Temp(Src) 98.4 F (36.9 C) (Oral)  Resp 16  Ht 5\' 7"  (1.702 m)  Wt 64.411 kg (142 lb)  BMI 22.24 kg/m2  SpO2 99%  LMP 07/07/2013      FHT:  FHR: 145 bpm, variability: minimal ,  accelerations:  Abscent,  decelerations:  Absent UC:   regular, every 2 minutes SVE:   Dilation: 10 Effacement (%): 100 Station: +2 Exam by:: Dherr rn  Labs: Lab Results  Component Value Date   WBC 11.2* 04/23/2014   HGB 12.8 04/23/2014   HCT 36.3 04/23/2014   MCV 93.1 04/23/2014   PLT 165 04/23/2014    Assessment / Plan: PEC stable s/p second dose Labetalol Active labor with good progress  Labor: Progressing normally Preeclampsia:  no signs or symptoms of toxicity, intake and ouput balanced and labs stable Fetal Wellbeing:  Category I and Category II Pain Control:  Epidural I/D:  n/a Anticipated MOD:  NSVD  Shayde Gervacio J 04/23/2014, 12:33 PM

## 2014-04-23 NOTE — Progress Notes (Signed)
This note also relates to the following rows which could not be included: BP - Cannot attach notes to unvalidated device data Pulse Rate - Cannot attach notes to unvalidated device data   Second redose

## 2014-04-23 NOTE — Progress Notes (Signed)
[place] stage 2 dr Primitivo Gauze, Dr [facility] RN Arby Barrette, [facility] coverage, RROB Erin in room Second IV line started

## 2014-04-23 NOTE — Plan of Care (Signed)
Problem: Phase II Progression Outcomes Goal: Assess lochia color, amount, odor, clots, clots size Outcome: Progressing PPH advaned to stage 2 Goal: Fundus firm and non-displaced Outcome: Progressing Fundus boggy , firm with massage, clots

## 2014-04-23 NOTE — Progress Notes (Signed)
Dr Ronita Hipps Placed Bakri filled with sterile water 220 cc

## 2014-04-23 NOTE — Op Note (Signed)
04/22/2014 - 04/23/2014  6:13 PM  PATIENT:  Anna Washington  33 y.o. female  PRE-OPERATIVE DIAGNOSIS:  post partum hemorrhage  POST-OPERATIVE DIAGNOSIS:  post partum hemorrhage  PROCEDURE:  Procedure(s): EXAM UNDER ANESTHESIA placement of Bakri Balloon DILATATION AND EVACUATION  SURGEON:  Surgeon(s): Lovenia Kim, MD  ASSISTANTS: none   ANESTHESIA:   epidural  ESTIMATED BLOOD LOSS: 50cc  DRAINS: Urinary Catheter (Foley)   LOCAL MEDICATIONS USED:  NONE  SPECIMEN:  Source of Specimen:  ? retained placenta  DISPOSITION OF SPECIMEN:  PATHOLOGY  COUNTS:  YES  DICTATION #: done  PLAN OF CARE: transfer to ICU  PATIENT DISPOSITION:  PACU - guarded condition.

## 2014-04-23 NOTE — Plan of Care (Signed)
Problem: Phase II Progression Outcomes Goal: Regular diet Outcome: Progressing Pt remained NPO

## 2014-04-23 NOTE — Anesthesia Procedure Notes (Signed)
Epidural Patient location during procedure: OB Start time: 04/23/2014 9:08 AM  Staffing Anesthesiologist: Erving Sassano Performed by: anesthesiologist   Preanesthetic Checklist Completed: patient identified, site marked, surgical consent, pre-op evaluation, timeout performed, IV checked, risks and benefits discussed and monitors and equipment checked  Epidural Patient position: sitting Prep: site prepped and draped and DuraPrep Patient monitoring: continuous pulse ox and blood pressure Approach: midline Location: L3-L4 Injection technique: LOR air  Needle:  Needle type: Tuohy  Needle gauge: 17 G Needle length: 9 cm and 9 Needle insertion depth: 3.5 cm Catheter type: closed end flexible Catheter size: 19 Gauge Catheter at skin depth: 8.5 cm Test dose: negative  Assessment Events: blood not aspirated, injection not painful, no injection resistance, negative IV test and no paresthesia  Additional Notes Discussed risk of headache, infection, bleeding, nerve injury and failed or incomplete block.  Patient voices understanding and wishes to proceed.  Epidural placed easily on first attempt.  No paresthesia.  Patient tolerated procedure well with no apparent complications.  Charlton Haws, MDReason for block:procedure for pain

## 2014-04-23 NOTE — Progress Notes (Signed)
This note also relates to the following rows which could not be included: BP - Cannot attach notes to unvalidated device data Pulse Rate - Cannot attach notes to unvalidated device data   First dose epidural redose

## 2014-04-24 ENCOUNTER — Encounter (HOSPITAL_COMMUNITY): Payer: Self-pay

## 2014-04-24 LAB — CBC
HEMATOCRIT: 33.7 % — AB (ref 36.0–46.0)
HEMOGLOBIN: 12.1 g/dL (ref 12.0–15.0)
MCH: 31.1 pg (ref 26.0–34.0)
MCHC: 35.9 g/dL (ref 30.0–36.0)
MCV: 86.6 fL (ref 78.0–100.0)
Platelets: 123 10*3/uL — ABNORMAL LOW (ref 150–400)
RBC: 3.89 MIL/uL (ref 3.87–5.11)
RDW: 15.1 % (ref 11.5–15.5)
WBC: 17.7 10*3/uL — ABNORMAL HIGH (ref 4.0–10.5)

## 2014-04-24 LAB — COMPREHENSIVE METABOLIC PANEL
ALK PHOS: 61 U/L (ref 39–117)
ALT: 17 U/L (ref 0–35)
ANION GAP: 10 (ref 5–15)
AST: 39 U/L — ABNORMAL HIGH (ref 0–37)
Albumin: 2 g/dL — ABNORMAL LOW (ref 3.5–5.2)
BILIRUBIN TOTAL: 0.7 mg/dL (ref 0.3–1.2)
BUN: 11 mg/dL (ref 6–23)
CHLORIDE: 101 meq/L (ref 96–112)
CO2: 21 meq/L (ref 19–32)
Calcium: 6.9 mg/dL — ABNORMAL LOW (ref 8.4–10.5)
Creatinine, Ser: 0.64 mg/dL (ref 0.50–1.10)
Glucose, Bld: 100 mg/dL — ABNORMAL HIGH (ref 70–99)
POTASSIUM: 4.3 meq/L (ref 3.7–5.3)
Sodium: 132 mEq/L — ABNORMAL LOW (ref 137–147)
Total Protein: 4 g/dL — ABNORMAL LOW (ref 6.0–8.3)

## 2014-04-24 MED ORDER — BENZOCAINE-MENTHOL 20-0.5 % EX AERO
1.0000 | INHALATION_SPRAY | CUTANEOUS | Status: DC | PRN
Start: 2014-04-24 — End: 2014-04-26
  Administered 2014-04-25: 1 via TOPICAL
  Filled 2014-04-24: qty 56

## 2014-04-24 MED ORDER — DIPHENHYDRAMINE HCL 25 MG PO CAPS
25.0000 mg | ORAL_CAPSULE | Freq: Four times a day (QID) | ORAL | Status: DC | PRN
Start: 2014-04-24 — End: 2014-04-26

## 2014-04-24 MED ORDER — DIPHENHYDRAMINE HCL 50 MG/ML IJ SOLN: 12.5000 mg | INTRAMUSCULAR | Status: DC | PRN

## 2014-04-24 MED ORDER — DIPHENHYDRAMINE HCL 25 MG PO CAPS: 25.0000 mg | ORAL_CAPSULE | ORAL | Status: DC | PRN

## 2014-04-24 MED ORDER — ONDANSETRON HCL 4 MG/2ML IJ SOLN
4.0000 mg | INTRAMUSCULAR | Status: DC | PRN
  Administered 2014-04-24: 4 mg via INTRAVENOUS
  Filled 2014-04-24 (×2): qty 2

## 2014-04-24 MED ORDER — SCOPOLAMINE 1 MG/3DAYS TD PT72
1.0000 | MEDICATED_PATCH | Freq: Once | TRANSDERMAL | Status: DC
  Filled 2014-04-24: qty 1

## 2014-04-24 MED ORDER — TETANUS-DIPHTH-ACELL PERTUSSIS 5-2.5-18.5 LF-MCG/0.5 IM SUSP
0.5000 mL | Freq: Once | INTRAMUSCULAR | Status: DC
  Filled 2014-04-24: qty 0.5

## 2014-04-24 MED ORDER — SIMETHICONE 80 MG PO CHEW: 80.0000 mg | CHEWABLE_TABLET | ORAL | Status: DC | PRN

## 2014-04-24 MED ORDER — DEXTROSE IN LACTATED RINGERS 5 % IV SOLN
INTRAVENOUS | Status: DC
  Administered 2014-04-24: 18:00:00 via INTRAVENOUS

## 2014-04-24 MED ORDER — NALBUPHINE HCL 10 MG/ML IJ SOLN
5.0000 mg | Freq: Once | INTRAMUSCULAR | Status: AC | PRN
  Filled 2014-04-24: qty 1

## 2014-04-24 MED ORDER — NALBUPHINE HCL 10 MG/ML IJ SOLN: 5.0000 mg | INTRAMUSCULAR | Status: DC | PRN

## 2014-04-24 MED ORDER — SENNOSIDES-DOCUSATE SODIUM 8.6-50 MG PO TABS
2.0000 | ORAL_TABLET | ORAL | Status: DC
  Administered 2014-04-24 – 2014-04-26 (×2): 2 via ORAL
  Filled 2014-04-24 (×2): qty 2

## 2014-04-24 MED ORDER — LANOLIN HYDROUS EX OINT: TOPICAL_OINTMENT | CUTANEOUS | Status: DC | PRN

## 2014-04-24 MED ORDER — NALBUPHINE HCL 10 MG/ML IJ SOLN
5.0000 mg | INTRAMUSCULAR | Status: DC | PRN
  Administered 2014-04-24: 5 mg via INTRAVENOUS

## 2014-04-24 MED ORDER — SENNOSIDES-DOCUSATE SODIUM 8.6-50 MG PO TABS: 2.0000 | ORAL_TABLET | ORAL | Status: DC

## 2014-04-24 MED ORDER — OXYCODONE-ACETAMINOPHEN 5-325 MG PO TABS: 1.0000 | ORAL_TABLET | ORAL | Status: DC | PRN

## 2014-04-24 MED ORDER — NALOXONE HCL 1 MG/ML IJ SOLN
1.0000 ug/kg/h | INTRAMUSCULAR | Status: DC | PRN
  Filled 2014-04-24: qty 2

## 2014-04-24 MED ORDER — ONDANSETRON HCL 4 MG/2ML IJ SOLN
4.0000 mg | Freq: Three times a day (TID) | INTRAMUSCULAR | Status: DC | PRN
  Administered 2014-04-24: 4 mg via INTRAVENOUS

## 2014-04-24 MED ORDER — ZOLPIDEM TARTRATE 5 MG PO TABS: 5.0000 mg | ORAL_TABLET | Freq: Every evening | ORAL | Status: DC | PRN

## 2014-04-24 MED ORDER — IBUPROFEN 600 MG PO TABS
600.0000 mg | ORAL_TABLET | Freq: Four times a day (QID) | ORAL | Status: DC
  Administered 2014-04-24 – 2014-04-26 (×9): 600 mg via ORAL
  Filled 2014-04-24 (×9): qty 1

## 2014-04-24 MED ORDER — WITCH HAZEL-GLYCERIN EX PADS
1.0000 | MEDICATED_PAD | CUTANEOUS | Status: DC | PRN
Start: 2014-04-24 — End: 2014-04-26

## 2014-04-24 MED ORDER — OXYCODONE-ACETAMINOPHEN 5-325 MG PO TABS: 2.0000 | ORAL_TABLET | ORAL | Status: DC | PRN

## 2014-04-24 MED ORDER — MEPERIDINE HCL 25 MG/ML IJ SOLN: 6.2500 mg | INTRAMUSCULAR | Status: DC | PRN

## 2014-04-24 MED ORDER — INFLUENZA VAC SPLIT QUAD 0.5 ML IM SUSY
0.5000 mL | PREFILLED_SYRINGE | INTRAMUSCULAR | Status: DC
  Filled 2014-04-24: qty 0.5

## 2014-04-24 MED ORDER — PRENATAL MULTIVITAMIN CH
1.0000 | ORAL_TABLET | Freq: Every day | ORAL | Status: DC
  Administered 2014-04-24 – 2014-04-26 (×3): 1 via ORAL
  Filled 2014-04-24 (×3): qty 1

## 2014-04-24 MED ORDER — ONDANSETRON HCL 4 MG PO TABS: 4.0000 mg | ORAL_TABLET | ORAL | Status: DC | PRN

## 2014-04-24 MED ORDER — SODIUM CHLORIDE 0.9 % IJ SOLN: 3.0000 mL | INTRAMUSCULAR | Status: DC | PRN

## 2014-04-24 MED ORDER — NALOXONE HCL 0.4 MG/ML IJ SOLN: 0.4000 mg | INTRAMUSCULAR | Status: DC | PRN

## 2014-04-24 MED ORDER — DIBUCAINE 1 % RE OINT: 1.0000 "application " | TOPICAL_OINTMENT | RECTAL | Status: DC | PRN

## 2014-04-24 MED ORDER — NALBUPHINE HCL 10 MG/ML IJ SOLN: 5.0000 mg | Freq: Once | INTRAMUSCULAR | Status: AC | PRN

## 2014-04-24 NOTE — Progress Notes (Signed)
Patient ID: Anna Washington, female   DOB: 09-29-1980, 33 y.o.   MRN: [id number] Pt resting comfortably No bleeding. Pain controlled. Nausea improved. BP 144/75  Pulse 62  Temp(Src) 98.4 F (36.9 C) (Oral)  Resp 16  Ht 5\' 7"  (1.702 m)  Wt 68.402 kg (150 lb 12.8 oz)  BMI 23.61 kg/m2  SpO2 97%  LMP 07/07/2013  Breastfeeding? Unknown  CBC    Component Value Date/Time   WBC 17.7* 04/24/2014 0400   RBC 3.89 04/24/2014 0400   HGB 12.1 04/24/2014 0400   HCT 33.7* 04/24/2014 0400   PLT 123* 04/24/2014 0400   MCV 86.6 04/24/2014 0400   MCH 31.1 04/24/2014 0400   MCHC 35.9 04/24/2014 0400   RDW 15.1 04/24/2014 0400    Labs and vs stable. Will gradually deflate Bakri today. Continue Foley and IV Abx at this time.

## 2014-04-24 NOTE — Op Note (Signed)
NAMEMADISIN, HASAN                ACCOUNT NO.:  [id number]  MEDICAL RECORD NO.:  [id number]  LOCATION:  6294                          FACILITY:  [facility]  PHYSICIAN:  Lovenia Kim, M.D.DATE OF BIRTH:  Jul 22, 1980  DATE OF PROCEDURE:  04/23/2014 DATE OF DISCHARGE:                              OPERATIVE REPORT   PREOPERATIVE DIAGNOSIS:  Postpartum hemorrhage.  POSTOPERATIVE DIAGNOSIS:  Postpartum hemorrhage.  PROCEDURE:  Exam under anesthesia, suction dilation and evacuation under ultrasound guidance, Bakri balloon placement under ultrasound guidance.  SURGEON:  Lovenia Kim, M.D.  ASSISTANT:  None.  ANESTHESIA:  Epidural.  ESTIMATED BLOOD LOSS:  During the procedure, 50 mL.  COMPLICATIONS:  None.  DRAINS:  Foley.  DISPOSITION:  The patient was taken to recovery in good and guarded condition.  OPERATIVE NOTE:  After noting spontaneous expulsion of Bakri balloon that was placed in Labor and Delivery suite, the patient was brought to the operating room due to continued bleeding.  Upon presentation, she was dosed epidural anesthetic prepped and draped in usual sterile fashion.  Foley catheter previously placed.  Exam under anesthesia reveals some active bleeding from the uterus.  Inspection reveals no evidence of perineal laceration or bleeding.  No evidence of cervical bleeding.  No evidence of vulvar or vaginal hematoma.  At this time, bleeding appears to be coming from the uterus.  Gentle suction was performed using a 14 mm suction curette under ultrasound guidance revealing no evidence for retained fragments and an otherwise clean endometrial cavity.  At this time, placement of the Bakri balloon into the fundal area was placed.  A 530 mL are placed with achievement of marked reduction in the bleeding during placement of the balloon and at the end of the procedure.  Ultrasound visualization reveals no evidence of bleeding cephalad to the balloon as the balloon was  occupying the mid and lower uterine segment.  Packing was placed using a large vaginal packing at the end of the procedure.  Reinspection reveals small dehiscence of the previously repaired perineal laceration.  A 2-0 Vicryl suture was placed with good hemostasis.  The patient tolerated the procedure well.  Ultrasound reconfirms good placement of the Bakri balloon and no evidence of bleeding above the balloon and the patient was transferred to recovery in guarded condition.  She will continue to receive 2 additional units of packed red blood cells.  She had been consented for transfusion prior to transfer verbally in her Labor and Delivery room.     Lovenia Kim, M.D.     RJT/MEDQ  D:  04/23/2014  T:  04/24/2014  Job:  [id number]

## 2014-04-24 NOTE — Progress Notes (Signed)
Patient ID: Anna Washington, female   DOB: 01/19/1981, 33 y.o.   MRN: [id number] PPD # 1 SVD / PPH / S/P Bakri Balloon  S:  Reports feeling a little better             Some nausea, but no vomiting since about 3 pm / tolerating crackers at this time             Bleeding is light since deflation/removal of Bakri balloon             Pain controlled with ibuprofen (OTC)             Up ad lib / ambulatory / Foley catheter in place draining to gravity    Newborn  Information for the patient's newborn:  Malkia, Nippert Girl Cicily [[id number]]  female  breast/bottle feeding    O:  A & O x 3, in no apparent distress              VS:  Filed Vitals:   04/24/14 1400 04/24/14 1500 04/24/14 1600 04/24/14 1700  BP: 126/69 133/69 127/79 146/83  Pulse: 67 59 66 69  Temp:  98 F (36.7 C)    TempSrc:      Resp: 16 16 16 16   Height:      Weight:      SpO2: 98% 98% 98% 98%    LABS:  Recent Labs  04/23/14 1846 04/24/14 0400  WBC 13.9* 17.7*  HGB 12.9 12.1  HCT 36.8 33.7*  PLT 117* 123*    Blood type: O POS (10/13 2345)  Rubella: Immune (03/13 0000)   I&O: I/O last 3 completed shifts: In: 64.6 [P.O.:210; I.V.:4344.6; Blood:1542; IV Piggyback:100] Out: 4400 [Urine:750; Emesis/NG output:455; Blood:3195]          Total I/O In: 1280 [P.O.:280; I.V.:1000] Out: 455 [Urine:255; Emesis/NG output:200] / decreased urine output   Lungs: Clear and unlabored  Heart: regular rate and rhythm / no murmurs  Abdomen: soft, non-tender, non-distended              Fundus: firm, non-tender, U even  Perineum: 2nd degree repair healing well  Lochia: minimal  Extremities: no edema, no calf pain or tenderness, no Homans    A/P: PPD # 1  33 y.o., G1P1001   Principal Problem:    Postpartum care following vaginal delivery (10/14)  Active Problems:    Oligohydramnios    Postpartum hemorrhage, postpartum condition   Doing well - stablizing status    Advance diet as tolerated  Routine post partum orders  D/C  Foley  Change IVFs to D5LR 258mL bolus, then 125 mL/hr  Strict I&O  *Dr. Ronita Hipps consulted on plan of care / recommend D/C Foley and changing IVFs / plan to reassess urine output   Graceann Congress, MSN, CNM 04/24/2014, 5:13 PM

## 2014-04-24 NOTE — Lactation Note (Addendum)
This note was copied from the chart of Anna Liberti Appleton. Lactation Consultation Note   Initial consult with this mom of a term baby, conceived by IVF, and is now 20 hours olf. Mom had a PPH and is in AICU. The baby is being fed formula, and mom is pumping. I reviewed hand expression with mom, and was able to express a drop of colostrum, ,  which was fed to the baby, by latching her to mom. [place] was very sleepy, no suckling, but basic breast feeding teaching sone with mom. Mom knows to call for questions/concerns. Baby is being formula fed for now, . Amounts of supplementation reviewed with mom. 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 (baby sleepy after formula feeding) Intervention(s): Skin to skin  Audible Swallowing: None  Type of Nipple: Everted at rest and after stimulation  Comfort (Breast/Nipple): Soft / non-tender     Hold (Positioning): Assistance needed to correctly position infant at breast and maintain latch. Intervention(s): Breastfeeding basics reviewed;Support Pillows;Position options;Skin to skin  LATCH Score: 5  Lactation Tools Discussed/Used Pump Review: Setup, frequency, and cleaning;Milk Storage;Other (comment) (hand expresion) Initiated by:: bedside rn Date initiated:: 04/24/14   Consult Status Consult Status: Follow-up Date: 04/25/14 Follow-up type: In-patient    Tonna Corner 04/24/2014, 9:47 AM

## 2014-04-24 NOTE — Plan of Care (Signed)
Problem: Phase I Progression Outcomes Goal: Voiding adequately Outcome: Not Progressing Decreased output, MD aware

## 2014-04-24 NOTE — Lactation Note (Signed)
This note was copied from the chart of Anna Evangelene Vora. Lactation Consultation Note RN stated mom was exhausted d/t PPH of 2418ml, had surgery for bleeding. Plans to breast and formula feeding. Baby is in [employer] d/t mom unable to care for baby at this time. LC will f/u in am. Patient Name: Anna Washington Today's Date: 04/24/2014     Maternal Data    Feeding Feeding Type: Formula Nipple Type: Slow - flow  LATCH Score/Interventions                      Lactation Tools Discussed/Used     Consult Status      Gem Conkle G 04/24/2014, 2:48 AM

## 2014-04-25 DIAGNOSIS — IMO0001 Reserved for inherently not codable concepts without codable children: Secondary | ICD-10-CM

## 2014-04-25 DIAGNOSIS — O119 Pre-existing hypertension with pre-eclampsia, unspecified trimester: Secondary | ICD-10-CM | POA: Diagnosis present

## 2014-04-25 NOTE — Anesthesia Postprocedure Evaluation (Signed)
  Anesthesia Post-op Note  Patient: Anna Washington  Procedure(s) Performed: Procedure(s): EXAM UNDER ANESTHESIA placement of Bakri Balloon (N/A) DILATATION AND EVACUATION (N/A)  Patient Location: ICU  Anesthesia Type:Epidural  Level of Consciousness: awake, alert  and oriented  Airway and Oxygen Therapy: Patient Spontanous Breathing  Post-op Pain: none  Post-op Assessment: Post-op Vital signs reviewed, Patient's Cardiovascular Status Stable, Respiratory Function Stable, No headache, No backache, No residual numbness and No residual motor weakness  Post-op Vital Signs: Reviewed and stable  Last Vitals:  Filed Vitals:   04/25/14 0700  BP:   Pulse:   Temp: 36.7 C  Resp: 16    Complications: No apparent anesthesia complications

## 2014-04-25 NOTE — Progress Notes (Signed)
PPD #2- SVD/AICU Day #2  Subjective:   Reports feeling well Tolerating po/ No nausea or vomiting No HA, visual disturbances, or epigastric pain No SOB or chest tightness Bleeding is light Pain controlled with Motrin Up ad lib / ambulatory / voiding without problems Newborn: breastfeeding-supplementing with formula     Objective:   VS:  VS:  Filed Vitals:   04/24/14 2010 04/24/14 2341 04/25/14 0417 04/25/14 0700  BP:  118/69 117/82   Pulse: 88 63 70   Temp: 98 F (36.7 C) 98.1 F (36.7 C) 98.1 F (36.7 C) 98 F (36.7 C)  TempSrc: Oral Oral Oral Oral  Resp: 18 18 18 16   Height:      Weight:      SpO2: 100% 99% 100%     LABS:  Recent Labs  04/23/14 1846 04/24/14 0400  WBC 13.9* 17.7*  HGB 12.9 12.1  PLT 117* 123*   Blood type: --/--/O POS, O POS (10/13 2345) Rubella: Immune (03/13 0000)   I&O: Intake/Output     10/15 0701 - 10/16 0700 10/16 0701 - 10/17 0700   P.O. 1460    I.V. (mL/kg) 1952.1 (28.5)    Blood     IV Piggyback     Total Intake(mL/kg) 3412.1 (49.9)    Urine (mL/kg/hr) 1435 (0.9)    Emesis/NG output 200 (0.1)    Blood     Total Output 1635     Net +1777.1            Physical Exam: Alert and oriented x3 Heart: RRR Lungs: CTA bilaterally Abdomen: soft, non-tender, non-distended  Fundus: firm, non-tender, U-2 Perineum: Well approximated, no significant erythema, edema, or drainage; healing well. Lochia: small Extremities: Trace BLE edema, no calf pain or tenderness    Assessment:  PPD #2 G1P1001/ S/P:induced vaginal, 2nd degree laceration PPH, S/p blood transfusion Chronic HTN w/SI PEC, delivered-stable Doing well    Plan: Transfer to pp floor Continue strict I/O Repeat PIH labs in am Routine post partum orders Possible D/C home tomorrow Discussed A/P with Dr. Ronita Hipps, agrees   Julianne Handler, N MSN, CNM 04/25/2014, 10:21 AM

## 2014-04-26 LAB — CBC
HEMATOCRIT: 28.3 % — AB (ref 36.0–46.0)
HEMOGLOBIN: 9.9 g/dL — AB (ref 12.0–15.0)
MCH: 31.2 pg (ref 26.0–34.0)
MCHC: 35 g/dL (ref 30.0–36.0)
MCV: 89.3 fL (ref 78.0–100.0)
Platelets: 159 10*3/uL (ref 150–400)
RBC: 3.17 MIL/uL — AB (ref 3.87–5.11)
RDW: 14.6 % (ref 11.5–15.5)
WBC: 7.4 10*3/uL (ref 4.0–10.5)

## 2014-04-26 LAB — TYPE AND SCREEN
ABO/RH(D): O POS
Antibody Screen: NEGATIVE
UNIT DIVISION: 0
UNIT DIVISION: 0
UNIT DIVISION: 0
UNIT DIVISION: 0
Unit division: 0
Unit division: 0
Unit division: 0
Unit division: 0

## 2014-04-26 LAB — COMPREHENSIVE METABOLIC PANEL
ALT: 24 U/L (ref 0–35)
ANION GAP: 7 (ref 5–15)
AST: 40 U/L — ABNORMAL HIGH (ref 0–37)
Albumin: 2.2 g/dL — ABNORMAL LOW (ref 3.5–5.2)
Alkaline Phosphatase: 58 U/L (ref 39–117)
BUN: 5 mg/dL — AB (ref 6–23)
CO2: 26 meq/L (ref 19–32)
Calcium: 8 mg/dL — ABNORMAL LOW (ref 8.4–10.5)
Chloride: 106 mEq/L (ref 96–112)
Creatinine, Ser: 0.54 mg/dL (ref 0.50–1.10)
GLUCOSE: 102 mg/dL — AB (ref 70–99)
Potassium: 4.2 mEq/L (ref 3.7–5.3)
Sodium: 139 mEq/L (ref 137–147)
TOTAL PROTEIN: 4.5 g/dL — AB (ref 6.0–8.3)
Total Bilirubin: 0.2 mg/dL — ABNORMAL LOW (ref 0.3–1.2)

## 2014-04-26 MED ORDER — POLYSACCHARIDE IRON COMPLEX 150 MG PO CAPS: 150.0000 mg | ORAL_CAPSULE | Freq: Every day | ORAL | Status: DC

## 2014-04-26 MED ORDER — IBUPROFEN 600 MG PO TABS: 600.0000 mg | ORAL_TABLET | Freq: Four times a day (QID) | ORAL | Status: DC

## 2014-04-26 MED ORDER — MAGNESIUM OXIDE 400 (241.3 MG) MG PO TABS: 200.0000 mg | ORAL_TABLET | Freq: Every day | ORAL | Status: DC

## 2014-04-26 MED ORDER — MAGNESIUM OXIDE 400 (241.3 MG) MG PO TABS
200.0000 mg | ORAL_TABLET | Freq: Every day | ORAL | Status: DC
  Filled 2014-04-26 (×2): qty 0.5

## 2014-04-26 MED ORDER — HYDROCHLOROTHIAZIDE 12.5 MG PO CAPS
12.5000 mg | ORAL_CAPSULE | Freq: Every day | ORAL | Status: DC
  Filled 2014-04-26 (×2): qty 1

## 2014-04-26 MED ORDER — HYDROCHLOROTHIAZIDE 12.5 MG PO CAPS: 12.5000 mg | ORAL_CAPSULE | Freq: Every day | ORAL | Status: DC

## 2014-04-26 NOTE — Discharge Summary (Signed)
POSTOPERATIVE DISCHARGE SUMMARY:  Patient ID: Anna Washington MRN: [id number] DOB/AGE: 35-Aug-1982 35 y.o.  Admit date: 04/22/2014 Admission Diagnoses:   Discharge date:  04/26/2014 Discharge Diagnoses: PPD 3 - SVD with postpartum hemorrhage requiring blood transfusion / POD 3 -D&E with Bakri balloon  Prenatal history: G1P1001   EDC : 04/19/2014, by Est. Date of Conception  Prenatal care at [facility]  Primary provider : [place] Prenatal course complicated by AMA / infertility - IVF pregnancy / chronic hypertension - no meds  Prenatal Labs: ABO, Rh: --/--/O POS, O POS (10/13 2345)  Antibody: NEG (10/13 2345) Rubella: Immune (03/13 0000)   RPR: NON REAC (10/13 2345)  HBsAg: Negative (03/13 0000)  HIV: Non-reactive (03/13 0000)  GTT : NL GBS: Negative (09/24 0000)   Medical / Surgical History :  Past medical history:  Past Medical History  Diagnosis Date  . Hypertension   . Newborn product of IVF pregnancy   . AMA (advanced maternal age) primigravida 70+   . Endometriosis   . Postpartum care following vaginal delivery (10/14) 04/24/2014  . Postpartum hemorrhage, postpartum condition 04/24/2014    Past surgical history:  Past Surgical History  Procedure Laterality Date  . Appendectomy    . Wisdom tooth extraction    . Ablation on endometriosis    . Examination under anesthesia N/A 04/23/2014    Procedure: EXAM UNDER ANESTHESIA placement of Bakri Balloon;  Surgeon: Lovenia Kim, MD;  Location: [facility];  Service: Gynecology;  Laterality: N/A;  . Dilation and evacuation N/A 04/23/2014    Procedure: DILATATION AND EVACUATION;  Surgeon: Lovenia Kim, MD;  Location: [facility];  Service: Gynecology;  Laterality: N/A;    Family History:  Family History  Problem Relation Age of Onset  . Cancer Maternal Grandmother     breast  . Heart attack Maternal Grandfather   . Diabetes Paternal Grandmother   . Diabetes Paternal Grandfather   . Cancer Paternal  Grandfather     prostate    Social History:  reports that she has never smoked. She does not have any smokeless tobacco history on file. She reports that she does not drink alcohol or use illicit drugs.  Allergies: Penicillins   Current Medications at time of admission:  Prior to Admission medications   Medication Sig Start Date End Date Taking? Authorizing Provider  Docosahexaenoic Acid (DHA PO) Take by mouth.   Yes Historical Provider, MD  OVER THE COUNTER MEDICATION Take 1 tablet by mouth daily. Patient takes Evening primrose   Yes Historical Provider, MD  Prenatal Vit-Fe Fumarate-FA (PRENATAL MULTIVITAMIN) TABS tablet Take 1 tablet by mouth daily at 12 noon.   Yes Historical Provider, MD   Intrapartum Course:  Normal labor and delivery course with SVD attended by Dr Ronita Hipps Postpartum hemorrhage unresponsive to standard management of pitocin / methergine / cytotec / failed first attempt at [facility] placement (displaced with further bleeding)  Procedures: D&E for hemorrhage / placement of Bakri balloon under ultrasound guidance  See operative report for further details  Postoperative / postpartum course:  Complicated by: postpartum hemorrhage / blood transfusion / D&E and placement of Bakri balloon for hemostasis / labile HTN - no medications required / dependent edema  Discharge Instructions:  Discharged Condition: stable  Activity: pelvic rest and postoperative restrictions x 2   Diet: routine  Medications:    Medication List    STOP taking these medications       OVER THE COUNTER MEDICATION  TAKE these medications       DHA PO  Take by mouth.     hydrochlorothiazide 12.5 MG capsule  Commonly known as:  MICROZIDE  Take 1 capsule (12.5 mg total) by mouth daily.     ibuprofen 600 MG tablet  Commonly known as:  ADVIL,MOTRIN  Take 1 tablet (600 mg total) by mouth every 6 (six) hours.     iron polysaccharides 150 MG capsule  Commonly known as:  NIFEREX  Take  1 capsule (150 mg total) by mouth daily.     magnesium oxide 400 (241.3 MG) MG tablet  Commonly known as:  MAG-OX  Take 0.5 tablets (200 mg total) by mouth daily.     prenatal multivitamin Tabs tablet  Take 1 tablet by mouth daily at 12 noon.        Postpartum Instructions: [facility] discharge booklet - instructions reviewed  Discharge to: Home  Follow up :  [facility] end of this week for interval visit with nurse / CNM / Dr Ronita Hipps  for reassessment [facility] in 6 weeks for routine postpartum visit with Dr Ronita Hipps                Signed: Artelia Laroche CNM, MSN, [facility] 04/26/2014, 2:36 PM

## 2014-04-26 NOTE — Progress Notes (Signed)
PPD #3 - SVD  S: Feeling much better, Mild fatigue      Pain well controlled with Motrin      Nausea/vomiting has resolved and tolerating regular diet      Occasional mild headache, no visual disturbances, no epigastric pain      Bleeding is bright red, scant      Breast/bottle feeding       Up ad lib/ambulatory/voiding with no problems      Desires Tdap upon discharge and will get Flu shot at her place of work      Requested to be discharged home   O:  Blood pressure 146/78, pulse 61, temperature 98.1 F (36.7 C), temperature source Oral, resp. rate 19, height 5\' 7"  (1.702 m), weight 68.402 kg (150 lb 12.8 oz), last menstrual period 07/07/2013, SpO2 99.00%, unknown if currently breastfeeding.    Intake/Output: Net Total: + 2,033.26ml  Physical Exam:  General: alert, oriented x 4 Heart: regular rate and rhythm Lungs: clear, equal bilaterally Abdomen: soft, non-tender, active bowel sounds Fundus: firm, U-2 Perineum: well approximated, no erythema, edema, drainage Lochia: scant, bright red Extremities: non-pitting edema, slight tightness, no calf pain or tenderness   A: 1. S/P SVD-baby girl, IOL, 2nd degree laceration 2. PPH- s/p blood transfusion  3. Chronic hypertension  - stable BP / labs stable 4. ABL anemia  5. dependent edema   P:  D/C IV Discharge home today Follow-up in office in 1 week Continue Prenatal vitamin Consider HCTZ 12.5 mg daily until recheck Consider Niferex 150mg  daily Mag Ox 200mg  daily with iron Encouraged Breastfeeding [facility] discharge booklet given Tdap upon discharge Call if increase in bleeding, pain, persistent headaches, visual disturbances, epigastric pain   Kassie Mends, SNM Artelia Laroche CNM [facility]

## 2014-04-26 NOTE — Plan of Care (Signed)
Problem: Discharge Progression Outcomes Goal: Complications resolved/controlled Outcome: Progressing Making sure blood pressure is stable before discharge. Encouraging patient to get some rest while baby is sleeping.

## 2014-05-01 ENCOUNTER — Encounter (HOSPITAL_COMMUNITY): Payer: Self-pay

## 2014-05-10 NOTE — Addendum Note (Signed)
Addendum created 05/10/14 1000 by Assunta Gambles, MD   Modules edited: Anesthesia Events, Anesthesia Medication Administration

## 2014-05-12 ENCOUNTER — Encounter (HOSPITAL_COMMUNITY): Payer: Self-pay

## 2015-07-01 IMAGING — US US OB DETAIL+14 WK
1 series · 12 of 28 positions shown · non-contrast
Comparison: none

[Series 1: us ob detail+14 wk · 0.23mm/px · 12 of 79 slices shown]
[im 3/79]
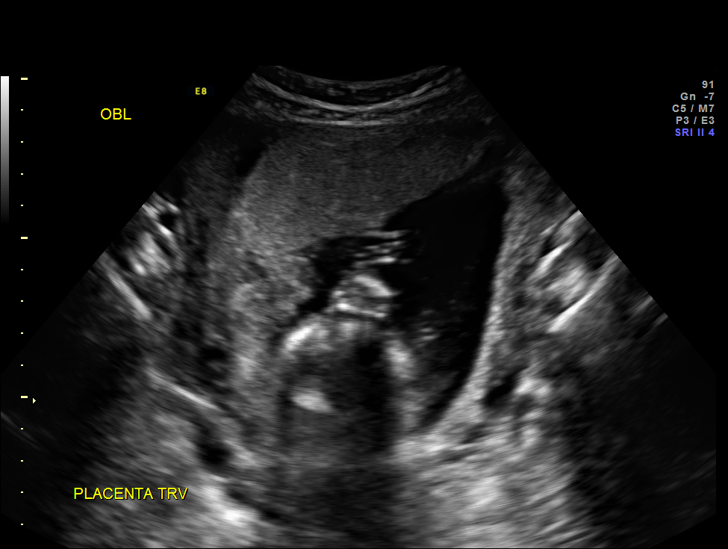
[im 9/79]
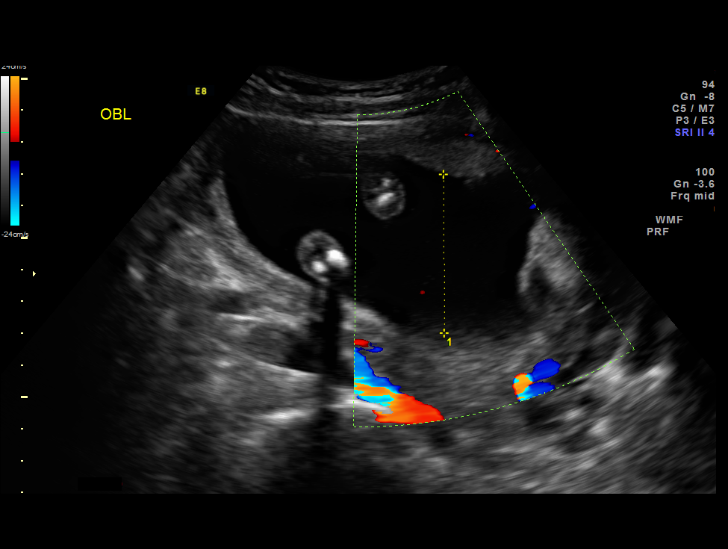
[im 15/79]
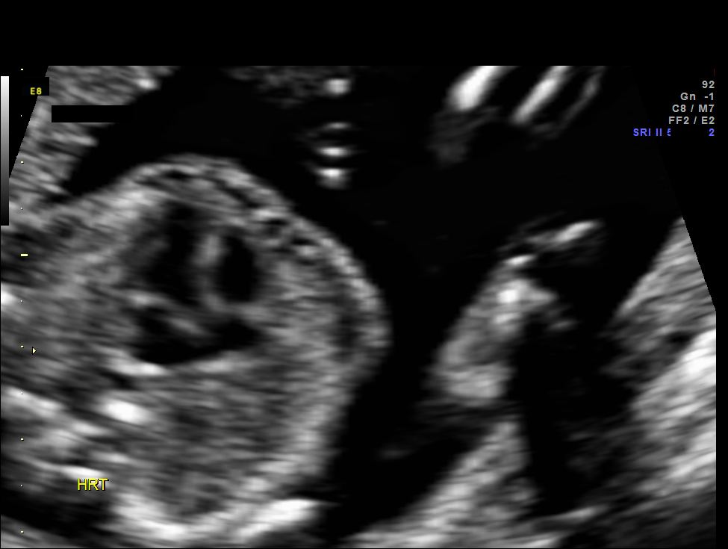
[im 24/79]
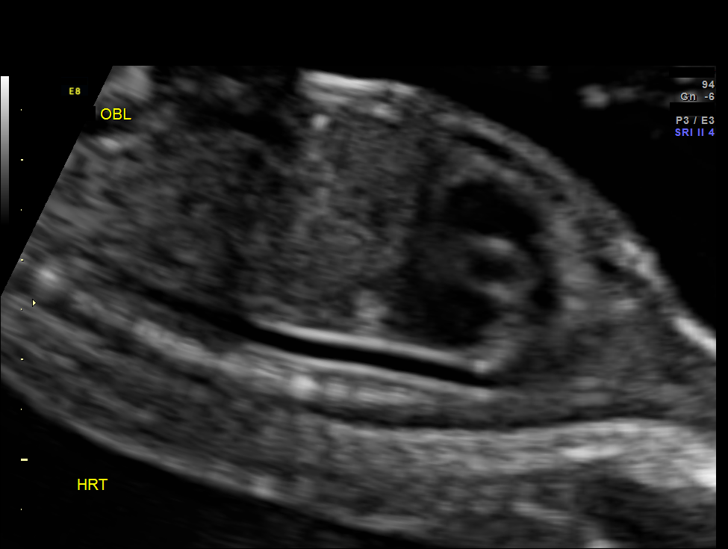
[im 29/79]
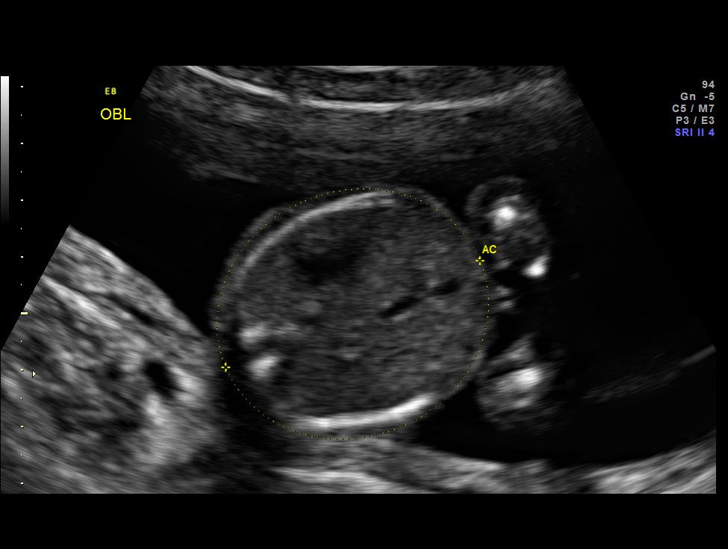
[im 35/79]
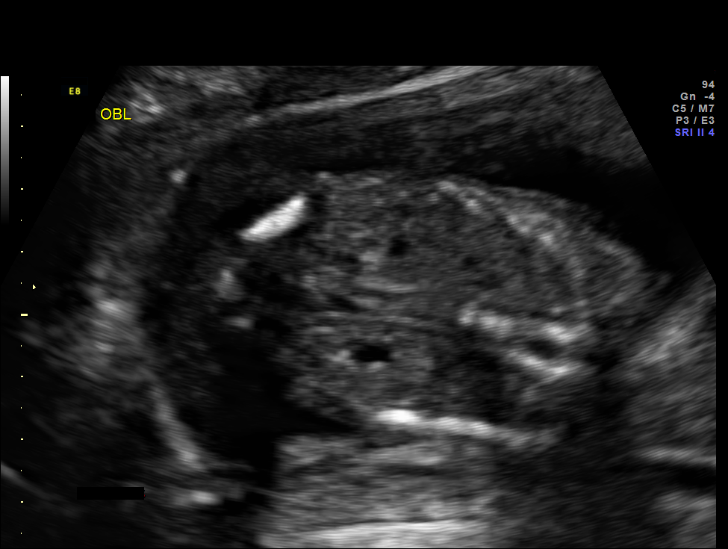
[im 44/79]
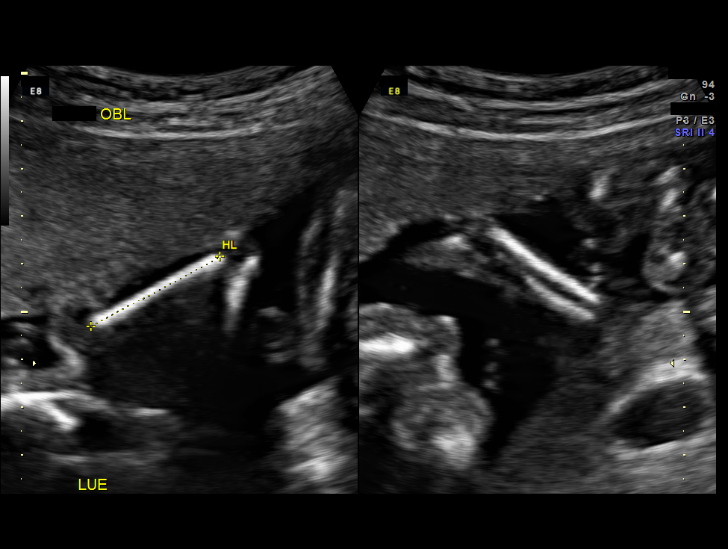
[im 50/79]
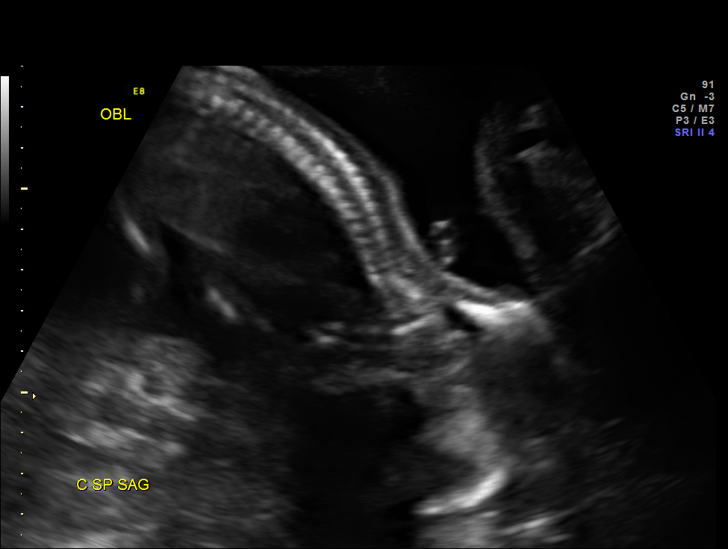
[im 55/79]
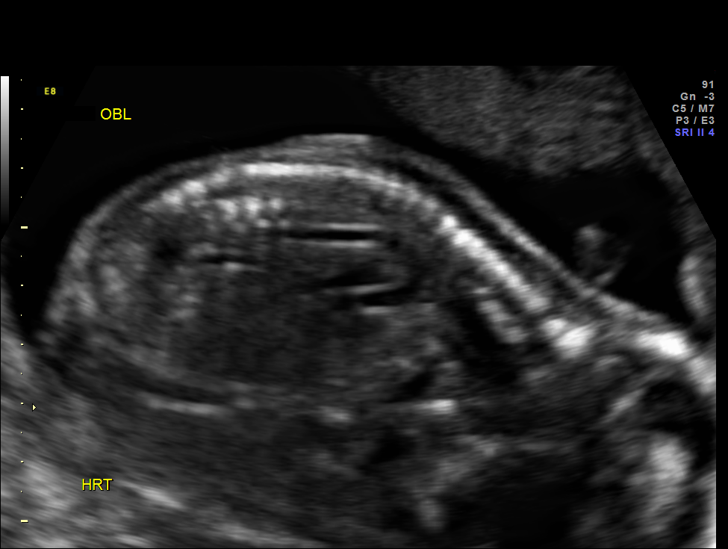
[im 64/79]
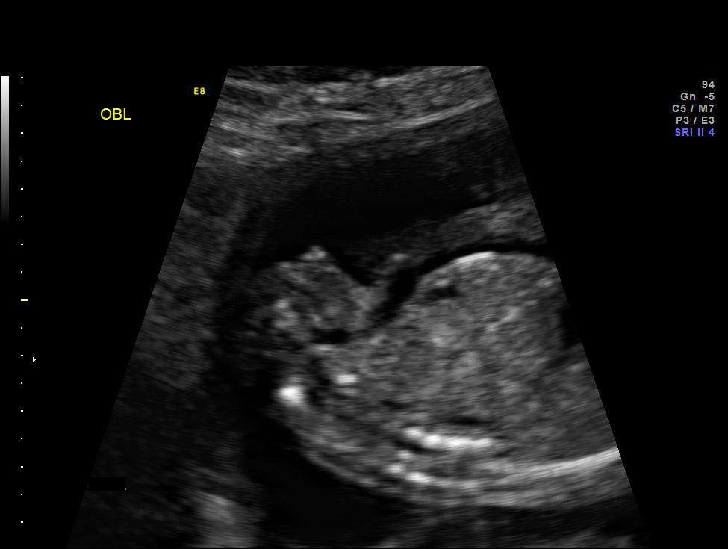
[im 70/79]
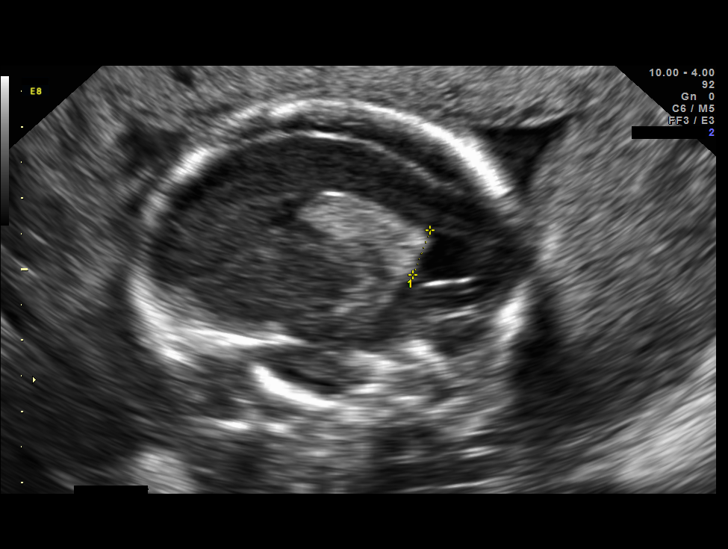
[im 76/79]
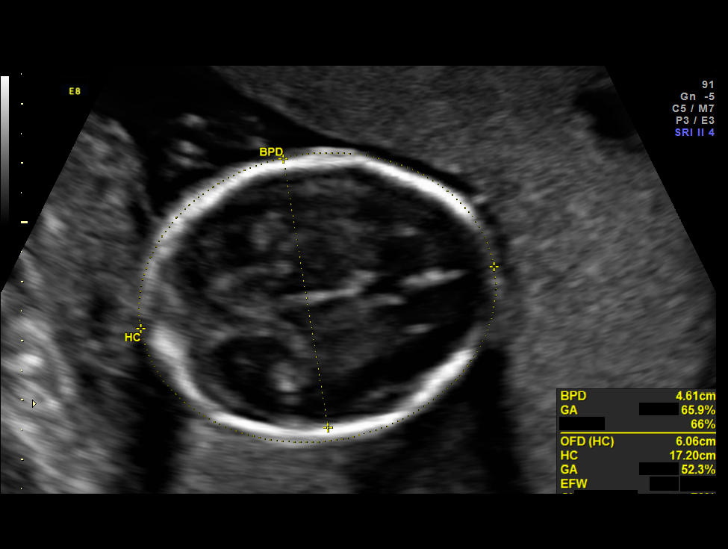

[12 of 28 positions shown; findings below may reference images not displayed]

OBSTETRICS REPORT
                      (Signed Final 11/27/2013 [DATE])

Service(s) Provided

 US OB DETAIL + 14 WK                                  76811.0
 US MFM OB TRANSVAGINAL                                76817.2
Indications

 Detailed fetal anatomic survey
 Twin gestation (failed second sac)
 Advanced maternal age (AMA), Primigravida -
 Negative first trimester screen, Low risk NIPS
 Hypertension - Chronic/Pre-existing (no meds)
 Pregnancy resulting from assisted reproductive
 technology (IVF)
Fetal Evaluation

 Num Of Fetuses:    1
 Fetal Heart Rate:  150                          bpm
 Cardiac Activity:  Observed
 Presentation:      Cephalic
 Placenta:          Anterior, above cervical os
 P. Cord            Visualized, central
 Insertion:

 Amniotic Fluid
 AFI FV:      Subjectively within normal limits
                                             Larg Pckt:     4.9  cm
Biometry

 BPD:     46.2  mm     G. Age:  20w 0d                CI:         75.7   70 - 86
 OFD:       61  mm                                    FL/HC:      17.9   16.8 -

 HC:     171.7  mm     G. Age:  19w 5d       51  %    HC/AC:      1.19   1.09 -

 AC:     144.7  mm     G. Age:  19w 5d       52  %    FL/BPD:
 FL:      30.7  mm     G. Age:  19w 4d       41  %    FL/AC:      21.2   20 - 24
 HUM:     30.5  mm     G. Age:  20w 1d       65  %
 CER:     20.1  mm     G. Age:  19w 1d       41  %
 NFT:        4  mm

 Est. FW:     307  gm    0 lb 11 oz      50  %
Gestational Age

 LMP:           20w 3d        Date:  07/07/13                 EDD:   04/13/14
 U/S Today:     19w 5d                                        EDD:   04/18/14
 Best:          19w 4d     Det. By:  D.O. Conception          EDD:   04/19/14
Anatomy

 Cranium:          Appears normal         Aortic Arch:      Appears normal
 Fetal Cavum:      Appears normal         Ductal Arch:      Appears normal
 Ventricles:       Appears normal         Diaphragm:        Appears normal
 Choroid Plexus:   Appears normal         Stomach:          Appears normal
 Cerebellum:       Appears normal         Abdomen:          Appears normal
 Posterior Fossa:  Appears normal         Abdominal Wall:   Appears nml (cord
                                                            insert, abd wall)
 Nuchal Fold:      Appears normal         Cord Vessels:     Appears normal (3
                                                            vessel cord)
 Face:             Appears normal         Kidneys:          Appear normal
                   (orbits and profile)
 Lips:             Not well visualized    Bladder:          Appears normal
 Heart:            Appears normal         Spine:            Appears normal
                   (4CH, axis, and
                   situs)
 RVOT:             Appears normal         Lower             Appears normal
                                          Extremities:
 LVOT:             Appears normal         Upper             Appears normal
                                          Extremities:

 Other:  Fetus appears to be a female. Heels and 5th digit visualized.
         Technically difficult due to fetal position.
Targeted Anatomy

 Fetal Central Nervous System
 Cisterna Magna:
Cervix Uterus Adnexa

 Cervical Length:    4        cm

 Cervix:       Normal appearance by transvaginal scan

 Adnexa:     No abnormality visualized.
Comments

 Ms. Priscilla reports that she underwent IVF with 2 embryos
 transfered.  There was an embryonic demise early in
 pregnancy (vanishing twin) with a gestational sac visible until
 approximately 12 weeks gestation.  This likely accounts for
 the discordant fetal sex results from the NIPs vs. ultrasound.
Impression

 Single IUP at 19w 4d
 Patient was seen today due to discordant results for fetal sex
 from cell free fetal DNA vs. ultrasound findings (see
 comments)
 Normal fetal anatomic survey
 Somewhat limited views of the fetal face were obtained (lips)
 The fetus appears to be female
 Normal amniotic fluid volume
Recommendations

 See separate note from Genetics counselor.
 Follow-up ultrasounds as clinically indicated.

 questions or concerns.

## 2018-04-26 DIAGNOSIS — H93292 Other abnormal auditory perceptions, left ear: Secondary | ICD-10-CM | POA: Insufficient documentation

## 2018-12-14 ENCOUNTER — Encounter: Payer: Self-pay | Admitting: Family Medicine

## 2018-12-14 ENCOUNTER — Other Ambulatory Visit: Payer: Self-pay

## 2018-12-14 ENCOUNTER — Ambulatory Visit: Payer: 59 | Admitting: Family Medicine

## 2018-12-14 VITALS — BP 120/80 | HR 75 | Temp 98.6°F | Ht 67.0 in | Wt 124.0 lb

## 2018-12-14 DIAGNOSIS — M25552 Pain in left hip: Secondary | ICD-10-CM | POA: Diagnosis not present

## 2018-12-14 DIAGNOSIS — Z7689 Persons encountering health services in other specified circumstances: Secondary | ICD-10-CM | POA: Diagnosis not present

## 2018-12-14 DIAGNOSIS — M25562 Pain in left knee: Secondary | ICD-10-CM | POA: Diagnosis not present

## 2018-12-14 DIAGNOSIS — G8929 Other chronic pain: Secondary | ICD-10-CM | POA: Diagnosis not present

## 2018-12-14 NOTE — Patient Instructions (Signed)
It was a pleasure meeting you today.   Go to [facility] for your left hip and pelvis X rays.   Try taking 1-2 Aleve at bedtime or twice daily if needed with food.   You may want to try heat to the area 15-20 minutes at a time.   We will call you with your XR result and refer to PT as our next step.

## 2018-12-14 NOTE — Progress Notes (Signed)
Subjective:    Patient ID: Anna Washington, female    DOB: 07-Aug-1980, 38 y.o.   MRN: [id number]  HPI Chief Complaint  Patient presents with  . new pt    new pt, hip pain 3+ months, left side radiaties down leg. can't sleep on left side.    She is new to the practice and here to establish care.  Has acute complaint of left hip pain. Previous medical care: [facility]. Dr. Fleet Contras  Other providers:  Dr. Ronita Hipps is OB/GYN  Complains of gradually worsening left hip pain for the past 3-6 months.  No known left hip injury but she does report having a right hip injury in HS with sports. She was on crutches.  Played field hockey and dance.  No history of similar pain or surgery with her left hip. Pain was initially dull and lately she has had throbbing pain when laying on her left side.  The pain occasionally radiates down the thigh into the knee.  States pain resolves when she has not laying on her left leg.   Denies pain with her usual daily activities.  States she can walk a mile or 2 without having any pain. States she often sits with her legs under her or crosses her legs often and is trying to stop doing this.  Reports also having mild intermittent left knee pain . States she is not sure if the hip and the knee pain are related.  Denies history of knee injury but does report chronic knee pain.  No locking, popping or giving away. States occasionally her left leg including the calf feels "heavy".  Taking ibuprofen 400 mg 3 nights per week and uses ice. Flipped her mattress.  None of this seems to help except for temporarily.  Denies fever, chills, night sweats, fatigue, unexplained weight loss, chest pain, palpitations, shortness of breath, back pain, abdominal pain, nausea, vomiting, diarrhea or constipation.  No urinary symptoms.  HTN in the past intermittently.   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 She is married and  her husband also works in Corporate treasurer.  She has a 16-1/2-year-old.  Reviewed allergies, medications, past medical, surgical, family, and social history.   Review of Systems Pertinent positives and negatives in the history of present illness.     Objective:   Physical Exam Constitutional:      General: She is not in acute distress.    Appearance: Normal appearance. She is normal weight. She is not ill-appearing.  Neck:     Musculoskeletal: Normal range of motion and neck supple.  Cardiovascular:     Rate and Rhythm: Normal rate and regular rhythm.     Pulses: Normal pulses.     Heart sounds: Normal heart sounds.  Pulmonary:     Effort: Pulmonary effort is normal.     Breath sounds: Normal breath sounds.  Musculoskeletal:     Left hip: She exhibits normal range of motion, normal strength, no tenderness and no swelling.     Left knee: Normal.     Lumbar back: Normal.     Left lower leg: Normal.     Comments: Pain present to left lateral hip with abduction against resistance only Negative homan's sign   Skin:    General: Skin is warm and dry.     Capillary Refill: Capillary refill takes less than 2 seconds.     Findings: No rash.  Neurological:     General: No focal deficit present.     Mental Status: She is alert and oriented to person, place, and time.     Cranial Nerves: No cranial nerve deficit.     Sensory: No sensory deficit.     Motor: No weakness.     Gait: Gait normal.     Deep Tendon Reflexes: Reflexes are normal and symmetric.    BP 120/80   Pulse 75   Temp 98.6 F (37 C) (Oral)   Ht 5\' 7"  (1.702 m)   Wt 124 lb (56.2 kg)   SpO2 99%   Breastfeeding No   BMI 19.42 kg/m         Assessment & Plan:  Left hip pain - Plan: DG Hip Unilat W OR W/O Pelvis Min 4 Views Left  Chronic pain of left knee  Encounter to establish care  She is a pleasant 38 year old female who is new to me today and here to establish care.  She is concerned about a gradually  worsening left hip pain.  Her exam is unremarkable. Extremities are neurovascularly intact.  No sign of DVT in her left calf which was a question that she did have. Plan to send her for a hip and pelvic x-ray and have her try 1-2 Aleve at bedtime since her pain is mainly when laying down or she may increase it to twice daily if needed.  Once we have her x-ray result, the plan is to refer her to for physical therapy.  Offered a referral to an orthopedist if needed.

## 2018-12-20 ENCOUNTER — Other Ambulatory Visit: Payer: Self-pay

## 2018-12-20 ENCOUNTER — Ambulatory Visit
Admission: RE | Admit: 2018-12-20 | Discharge: 2018-12-20 | Disposition: A | Discharge status: Home / Self Care | Payer: 59 | Source: Ambulatory Visit | Attending: Family Medicine | Admitting: Family Medicine

## 2018-12-20 ENCOUNTER — Other Ambulatory Visit: Payer: Self-pay | Admitting: Family Medicine

## 2018-12-20 DIAGNOSIS — M25552 Pain in left hip: Secondary | ICD-10-CM

## 2018-12-21 ENCOUNTER — Other Ambulatory Visit: Payer: Self-pay

## 2018-12-21 DIAGNOSIS — M25552 Pain in left hip: Secondary | ICD-10-CM

## 2018-12-24 ENCOUNTER — Telehealth: Payer: Self-pay | Admitting: Family Medicine

## 2018-12-24 NOTE — Telephone Encounter (Signed)
Pt was notified of results

## 2018-12-24 NOTE — Telephone Encounter (Signed)
Pt called in response to missed calls from [place]  Gave her xray results  States she is still having problems and does want ortho referral  Please call

## 2018-12-27 ENCOUNTER — Ambulatory Visit: Payer: 59 | Admitting: Orthopaedic Surgery

## 2018-12-28 ENCOUNTER — Encounter: Payer: Self-pay | Admitting: Orthopaedic Surgery

## 2018-12-28 ENCOUNTER — Other Ambulatory Visit: Payer: Self-pay

## 2018-12-28 ENCOUNTER — Ambulatory Visit (INDEPENDENT_AMBULATORY_CARE_PROVIDER_SITE_OTHER): Payer: 59 | Admitting: Orthopaedic Surgery

## 2018-12-28 ENCOUNTER — Ambulatory Visit (INDEPENDENT_AMBULATORY_CARE_PROVIDER_SITE_OTHER): Payer: 59

## 2018-12-28 VITALS — Ht 67.0 in | Wt 124.0 lb

## 2018-12-28 DIAGNOSIS — M25562 Pain in left knee: Secondary | ICD-10-CM

## 2018-12-28 DIAGNOSIS — G8929 Other chronic pain: Secondary | ICD-10-CM | POA: Diagnosis not present

## 2018-12-28 DIAGNOSIS — M25552 Pain in left hip: Secondary | ICD-10-CM

## 2018-12-28 NOTE — Progress Notes (Signed)
Office Visit Note   Patient: Anna Washington           Date of Birth: 1981/03/12           MRN: [id number] Visit Date: 12/28/2018              Requested by: Girtha Rm, NP-C [place],  [place] [address] PCP: Girtha Rm, NP-C   Assessment & Plan: Visit Diagnoses:  1. Pain of left hip joint   2. Chronic pain of left knee     Plan: I suspect that she has a labral tear of her left hip therefore we will order an MR arthrogram to fully evaluate for this.  She likely has some trochanteric bursitis and piriformis syndrome for which I have referred her to outpatient physical therapy.  For her knees her x-rays are unremarkable.  I feel that she is presenting with chondromalacia of the patella.  She will also do outpatient physical therapy for this as well as home exercises.  We will see her back after the MR arthrogram.  Questions encouraged and answered.  Follow-Up Instructions: Return in about 2 weeks (around 01/11/2019).   Orders:  Orders Placed This Encounter  Procedures   XR KNEE 3 VIEW LEFT   Arthrogram   MR Hip Left w/ contrast   No orders of the defined types were placed in this encounter.     Procedures: No procedures performed   Clinical Data: No additional findings.   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  She states that she has groin pain as well as lateral sided hip pain and posterior lateral hip pain that will occasionally radiate down into the foot.  Denies any back pain.  She has done extensive home exercises as well as physical therapy and is now painful on a daily basis where she is taking Aleve and ibuprofen in order to just sleep at night.  She denies any numbness and tingling.  Denies any previous injuries.  She does  states the groin pain will radiate down into the thigh and knee.  Her left knee is also bothering her.  She endorses anterior knee pain.  She has a history of subluxating patellas back in high school.  She was a Tourist information centre manager.  She denies any dislocations of her patellas.  Denies any swelling.  Recommend ibuprofen and Aleve as needed.   Review of Systems  Constitutional: Negative.   HENT: Negative.   Eyes: Negative.   Respiratory: Negative.   Cardiovascular: Negative.   Endocrine: Negative.   Musculoskeletal: Negative.   Neurological: Negative.   Hematological: Negative.   Psychiatric/Behavioral: Negative.   All other systems reviewed and are negative.    Objective: Vital Signs: Ht 5\' 7"  (1.702 m)    Wt 124 lb (56.2 kg)    BMI 19.42 kg/m   Physical Exam Vitals signs and nursing note reviewed.  Constitutional:      Appearance: She is well-developed.  HENT:     Head: Normocephalic and atraumatic.  Neck:     Musculoskeletal: Neck supple.  Pulmonary:     Effort: Pulmonary effort is normal.  Abdominal:     Palpations: Abdomen is soft.  Skin:    General: Skin is warm.  Capillary Refill: Capillary refill takes less than 2 seconds.  Neurological:     Mental Status: She is alert and oriented to person, place, and time.  Psychiatric:        Behavior: Behavior normal.        Thought Content: Thought content normal.        Judgment: Judgment normal.     Ortho Exam Left hip exam shows good range of motion with pain in the groin with Corky Sox.  Positive FADIR.  Greater trochanter is mildly tender to palpation.  No sciatic tension signs.  Negative Stinchfield sign.  Left knee exam shows no joint effusion.  1+ patellofemoral crepitus.  Patella mobility is symmetric.  Normal range of motion.  Collaterals and cruciates are stable. Specialty Comments:  No specialty comments available.  Imaging: Xr Knee 3 View Left  Result Date: 12/28/2018 No acute or structural abnormalities    PMFS  History: Patient Active Problem List   Diagnosis Date Noted   Chronic hypertension with superimposed preeclampsia 04/25/2014   Blood transfusion during current hospitalization 04/25/2014   Postpartum care following vaginal delivery (10/14) 04/24/2014   Postpartum hemorrhage, postpartum condition 04/24/2014   Oligohydramnios 04/22/2014   Hereditary disease in family possibly affecting fetus, affecting management of mother, antepartum condition or complication 62/83/1517   Abnormal findings on antenatal screening 11/27/2013   Past Medical History:  Diagnosis Date   AMA (advanced maternal age) primigravida 35+    Endometriosis    Hypertension    Newborn product of IVF pregnancy    Postpartum care following vaginal delivery (10/14) 04/24/2014   Postpartum hemorrhage, postpartum condition 04/24/2014    Family History  Problem Relation Age of Onset   Cancer Maternal Grandmother        breast   Heart attack Maternal Grandfather    Diabetes Paternal Grandmother    Diabetes Paternal Grandfather    Cancer Paternal Grandfather        prostate    Past Surgical History:  Procedure Laterality Date   ABLATION ON ENDOMETRIOSIS     APPENDECTOMY     DILATION AND EVACUATION N/A 04/23/2014   Procedure: DILATATION AND EVACUATION;  Surgeon: Lovenia Kim, MD;  Location: [facility];  Service: Gynecology;  Laterality: N/A;   EXAMINATION UNDER ANESTHESIA N/A 04/23/2014   Procedure: EXAM UNDER ANESTHESIA placement of Bakri Balloon;  Surgeon: Lovenia Kim, MD;  Location: [facility];  Service: Gynecology;  Laterality: N/A;   WISDOM TOOTH EXTRACTION     Social History   Occupational History   Not on file  Tobacco Use   Smoking status: Never Smoker   Smokeless tobacco: Never Used  Substance and Sexual Activity   Alcohol use: No   Drug use: No   Sexual activity: Yes

## 2019-01-03 ENCOUNTER — Encounter: Payer: Self-pay | Admitting: Physical Therapy

## 2019-01-03 ENCOUNTER — Other Ambulatory Visit: Payer: Self-pay

## 2019-01-03 ENCOUNTER — Ambulatory Visit (INDEPENDENT_AMBULATORY_CARE_PROVIDER_SITE_OTHER): Payer: 59 | Admitting: Physical Therapy

## 2019-01-03 DIAGNOSIS — G8929 Other chronic pain: Secondary | ICD-10-CM | POA: Diagnosis not present

## 2019-01-03 DIAGNOSIS — M25562 Pain in left knee: Secondary | ICD-10-CM

## 2019-01-03 DIAGNOSIS — M25652 Stiffness of left hip, not elsewhere classified: Secondary | ICD-10-CM | POA: Diagnosis not present

## 2019-01-03 DIAGNOSIS — M25552 Pain in left hip: Secondary | ICD-10-CM

## 2019-01-03 NOTE — Patient Instructions (Signed)
Access Code: [email or web address]  URL: [email or web address]  Date: 01/03/2019  Prepared by: Lyndee Hensen   Exercises Prone Quadriceps Stretch with Strap - 3 reps - 30 hold - 2x daily Supine ITB Stretch with Strap - 3 reps - 30 hold - 2x daily Sidelying ITB Stretch off Table - 3 reps - 30 hold - 2x daily Standing ITB Stretch - 3 reps - 30 hold - 2x daily Supine Figure 4 Piriformis Stretch - 3 reps - 30 hold - 2x daily Supine Bridge - 10 reps - 2 sets - 2x daily Sidelying Hip Abduction - 10 reps - 2 sets - 2x daily

## 2019-01-03 NOTE — Therapy (Signed)
[place], [place], [address] Phone: [phone]   Fax:  [phone]  Physical Therapy Evaluation  Patient Details  Name: Anna Washington MRN: [id number] Date of Birth: 15-Dec-1980 Referring Provider (PT): Erlinda Hong   Encounter Date: 01/03/2019  PT End of Session - 01/03/19 1225    Visit Number  1    Number of Visits  12    Date for PT Re-Evaluation  02/14/19    Authorization Type  [facility]    PT Start Time  0809    PT Stop Time  0850    PT Time Calculation (min)  41 min    Activity Tolerance  Patient tolerated treatment well    Behavior During Therapy  [facility] for tasks assessed/performed       Past Medical History:  Diagnosis Date  . AMA (advanced maternal age) primigravida 69+   . Endometriosis   . Hypertension   . Newborn product of IVF pregnancy   . Postpartum care following vaginal delivery (10/14) 04/24/2014  . Postpartum hemorrhage, postpartum condition 04/24/2014    Past Surgical History:  Procedure Laterality Date  . ABLATION ON ENDOMETRIOSIS    . APPENDECTOMY    . DILATION AND EVACUATION N/A 04/23/2014   Procedure: DILATATION AND EVACUATION;  Surgeon: Lovenia Kim, MD;  Location: [facility];  Service: Gynecology;  Laterality: N/A;  . EXAMINATION UNDER ANESTHESIA N/A 04/23/2014   Procedure: EXAM UNDER ANESTHESIA placement of Bakri Balloon;  Surgeon: Lovenia Kim, MD;  Location: [facility];  Service: Gynecology;  Laterality: N/A;  . WISDOM TOOTH EXTRACTION      There were no vitals filed for this visit.   Subjective Assessment - 01/03/19 1211    Subjective  Pt states ongoing pain in L hip, for several months. Also has pain in L anterior knee, that started about 1-2 years ago. She states deep pain in L hip, and burning into anterior and lateral thigh. Worse in AM, and with prolonged activity. 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 Usually active,  yoga, walking. Pain has been limiting to full activities.Awaiting appt for MRI    Limitations  Sitting;Lifting;Standing;Walking;House hold activities;Other (comment)    Patient Stated Goals  decreased pain, increased strength and ability for exercise.    Currently in Pain?  Yes    Pain Score  6     Pain Location  Hip    Pain Orientation  Left    Pain Descriptors / Indicators  Aching;Burning;Throbbing;Tightness    Pain Type  Acute pain    Pain Onset  More than a month ago    Pain Frequency  Intermittent    Aggravating Factors   AM, prolonged sitting, standing/walking, work positioning.    Pain Relieving Factors  stretching, rest         [facility] PT Assessment - 01/03/19 0001      Assessment   Medical Diagnosis  L hip pain    Referring Provider (PT)  Xu    Prior Therapy  no      Precautions   Precautions  None      Restrictions   Weight Bearing Restrictions  No      Balance Screen   Has the patient fallen in the past 6 months  No      Prior Function   Level of Independence  Independent      Cognition   Overall Cognitive Status  Within Functional Limits for tasks assessed      Posture/Postural Control   Posture Comments  unremarkable/WFL      ROM / Strength   AROM / PROM / Strength  AROM;Strength      AROM   Overall AROM Comments  L hip- Flexion: increased pain with full flexion,  Mod limitation for ER, with pain.       Strength   Overall Strength Comments  L hip: 4/5 all motions      Palpation   Palpation comment  Limited ER, Pain at anterior hip/ASIS, pain along groin line, soreness in lateral hip/gr troch region, soreness and trigger points in glute med region       Special Tests   Other special tests  + FADIR, Pain and limitation also with FABER, Neg SLR/LLTT, L knee with poor patella tracking, increased crepitus.                Objective measurements completed on examination: See above findings.      [facility] PT Treatment/Exercise - 01/03/19  0001      Exercises   Exercises  Knee/Hip      Knee/Hip Exercises: Stretches   Hip Flexor Stretch  2 reps;30 seconds    Hip Flexor Stretch Limitations  prone with strap, towel under knee    ITB Stretch  3 reps;30 seconds;Left    ITB Stretch Limitations  standing fwd bend// and supine with strap     Piriformis Stretch  2 reps;30 seconds    Piriformis Stretch Limitations  Fig 4, supine    Other Knee/Hip Stretches  ITB, s/l off table, arm overhead x1 min             PT Education - 01/03/19 1225    Education Details  PT POC, exam findings, HEP    Person(s) Educated  Patient    Methods  Explanation;Demonstration;Tactile cues;Verbal cues;Handout    Comprehension  Verbalized understanding;Tactile cues required;Verbal cues required;Need further instruction;Returned demonstration       PT Short Term Goals - 01/03/19 1227      PT SHORT TERM GOAL #1   Title  Pt to be independent with initial HEP    Time  2    Period  Weeks    Status  New    Target Date  01/17/19        PT Long Term Goals - 01/03/19 1235      PT LONG TERM GOAL #1   Title  Pt to report decreased pain in L hip, to 0-2/10 with standing activity    Time  6    Period  Weeks    Status  New    Target Date  02/14/19      PT LONG TERM GOAL #2   Title  Pt to demo improved ability for flex and ER ROM of L hip, without pain, to improve ability for exercise and work duties.    Time  6    Period  Weeks    Status  New    Target Date  02/14/19      PT LONG TERM GOAL #3   Title  Pt to demo stability and NMC of R hip and knee to be [facility] for pt age, to improve pain and ambulation    Time  6    Period  Weeks    Status  New    Target Date  02/14/19      PT LONG TERM GOAL #4  Title  Pt to demo soft tissue restrictions in anterior, lateral, and posterior hip to be WNL, to decrease pain and maximize ROM.    Time  6    Period  Weeks    Status  New    Target Date  02/14/19             Plan - 01/03/19 1346     Clinical Impression Statement  Pt presents with primary complaint of L hip pain. She has pain in anterior and lateral hip, as well as deep groin pain, with + FADIR. She has tenderness and tightness of lateral and posterior hip musculature. She has limted and painful ROM, for flexion and rotation. Pt with limitations for standing, walking, and work duties,due to pain. Pt to benefit from skilled PT to improve deficits and return to PLOF without pain. She is awaiting MRI to be scheduled to rule out labral pathology.    Examination-Activity Limitations  Locomotion Level;Sit;Bend;Squat;Sleep;Stairs    Examination-Participation Restrictions  Cleaning;Community Activity    Stability/Clinical Decision Making  Stable/Uncomplicated    Clinical Decision Making  Low    Rehab Potential  Good    PT Frequency  2x / week    PT Duration  6 weeks    PT Treatment/Interventions  ADLs/Self Care Home Management;Cryotherapy;Electrical Stimulation;DME Instruction;Ultrasound;Moist Heat;Iontophoresis 4mg /ml Dexamethasone;Gait training;Stair training;Functional mobility training;Therapeutic activities;Therapeutic exercise;Balance training;Orthotic Fit/Training;Patient/family education;Neuromuscular re-education;Manual techniques;Passive range of motion;Dry needling;Taping;Spinal Manipulations;Joint Manipulations    Consulted and Agree with Plan of Care  Patient       Patient will benefit from skilled therapeutic intervention in order to improve the following deficits and impairments:  Decreased range of motion, Difficulty walking, Increased muscle spasms, Decreased endurance, Decreased activity tolerance, Pain, Impaired flexibility, Hypomobility, Decreased mobility, Decreased strength  Visit Diagnosis: 1. Left hip pain   2. Stiffness of left hip, not elsewhere classified   3. Chronic pain of left knee        Problem List Patient Active Problem List   Diagnosis Date Noted  . Chronic hypertension with superimposed  preeclampsia 04/25/2014  . Blood transfusion during current hospitalization 04/25/2014  . Postpartum care following vaginal delivery (10/14) 04/24/2014  . Postpartum hemorrhage, postpartum condition 04/24/2014  . Oligohydramnios 04/22/2014  . Hereditary disease in family possibly affecting fetus, affecting management of mother, antepartum condition or complication 46/65/9935  . Abnormal findings on antenatal screening 11/27/2013   Lyndee Hensen, PT, DPT 1:52 PM  01/03/19    [place], [place], [address] Phone: [phone]   Fax:  [phone]  Name: CIDNEY KIRKWOOD MRN: [id number] Date of Birth: August 29, 1980

## 2019-01-09 ENCOUNTER — Encounter: Payer: 59 | Admitting: Physical Therapy

## 2019-01-09 ENCOUNTER — Ambulatory Visit: Payer: 59 | Admitting: Orthopaedic Surgery

## 2019-01-10 ENCOUNTER — Ambulatory Visit (INDEPENDENT_AMBULATORY_CARE_PROVIDER_SITE_OTHER): Payer: 59 | Admitting: Physical Therapy

## 2019-01-10 ENCOUNTER — Other Ambulatory Visit: Payer: Self-pay

## 2019-01-10 ENCOUNTER — Encounter: Payer: Self-pay | Admitting: Physical Therapy

## 2019-01-10 DIAGNOSIS — M25652 Stiffness of left hip, not elsewhere classified: Secondary | ICD-10-CM | POA: Diagnosis not present

## 2019-01-10 DIAGNOSIS — M25552 Pain in left hip: Secondary | ICD-10-CM | POA: Diagnosis not present

## 2019-01-10 NOTE — Therapy (Signed)
[facility] [address] [place], [place], [address] Phone: [phone]   Fax:  [phone]  Physical Therapy Treatment  Patient Details  Name: Anna Washington MRN: [id number] Date of Birth: 08/15/1980 Referring Provider (PT): Erlinda Hong   Encounter Date: 01/10/2019  PT End of Session - 01/10/19 1418    Visit Number  2    Number of Visits  12    Date for PT Re-Evaluation  02/14/19    Authorization Type  UMR    PT Start Time  1513    PT Stop Time  1557    PT Time Calculation (min)  44 min    Activity Tolerance  Patient tolerated treatment well    Behavior During Therapy  [facility] for tasks assessed/performed       Past Medical History:  Diagnosis Date  . AMA (advanced maternal age) primigravida 75+   . Endometriosis   . Hypertension   . Newborn product of IVF pregnancy   . Postpartum care following vaginal delivery (10/14) 04/24/2014  . Postpartum hemorrhage, postpartum condition 04/24/2014    Past Surgical History:  Procedure Laterality Date  . ABLATION ON ENDOMETRIOSIS    . APPENDECTOMY    . DILATION AND EVACUATION N/A 04/23/2014   Procedure: DILATATION AND EVACUATION;  Surgeon: Lovenia Kim, MD;  Location: [facility];  Service: Gynecology;  Laterality: N/A;  . EXAMINATION UNDER ANESTHESIA N/A 04/23/2014   Procedure: EXAM UNDER ANESTHESIA placement of Bakri Balloon;  Surgeon: Lovenia Kim, MD;  Location: [facility];  Service: Gynecology;  Laterality: N/A;  . WISDOM TOOTH EXTRACTION      There were no vitals filed for this visit.  Subjective Assessment - 01/10/19 1417    Subjective  Pt states mild relief from last session and stretching. Does have increased pain today and yesterday in anterior hip, states difficulty with static sitting, due to pain anteriorly in groin.    Currently in Pain?  Yes    Pain Score  4     Pain Location  Hip    Pain Orientation  Left    Pain Descriptors / Indicators  Aching;Tightness;Burning;Throbbing    Pain Type  Acute pain    Pain Onset  More than a month ago    Pain Frequency  Intermittent                       [facility] Adult PT Treatment/Exercise - 01/10/19 1411      Posture/Postural Control   Posture Comments  --      Exercises   Exercises  Knee/Hip      Knee/Hip Exercises: Stretches   Hip Flexor Stretch  3 reps;30 seconds    Hip Flexor Stretch Limitations  kneeling on AirEx    ITB Stretch  --    ITB Stretch Limitations  --    Piriformis Stretch  30 seconds;3 reps    Piriformis Stretch Limitations  Fig 4, supine and seated    Other Knee/Hip Stretches  --      Knee/Hip Exercises: Supine   Bridges  20 reps    Other Supine Knee/Hip Exercises  Clams GTB alternating x25;       Knee/Hip Exercises: Sidelying   Hip ABduction  20 reps;Both      Manual Therapy   Manual Therapy  Joint mobilization;Soft tissue mobilization    Manual therapy comments  skilled palpation and monitoring of soft tissue during dry needling.     Joint Mobilization  Gr 3 hip mobs, all motions, with strap, long leg distraction 10 sec x5;     Soft tissue mobilization  DTM/IASTM to L glutes and hip rotators        Trigger Point Dry Needling - 01/10/19 0001    Consent Given?  Yes    Education Handout Provided  Yes    Muscles Treated Back/Hip  Gluteus maximus;Gluteus medius;Obturator internus    Gluteus Medius Response  Palpable increased muscle length    Gluteus Maximus Response  Palpable increased muscle length    Obturator internus Response  Palpable increased muscle length             PT Short Term Goals - 01/03/19 1227      PT SHORT TERM GOAL #1   Title  Pt to be independent with initial HEP    Time  2    Period  Weeks    Status  New    Target Date  01/17/19        PT Long Term Goals - 01/03/19 1235      PT LONG TERM GOAL #1   Title  Pt to report decreased pain in L hip, to 0-2/10 with standing activity    Time  6    Period  Weeks    Status  New    Target Date   02/14/19      PT LONG TERM GOAL #2   Title  Pt to demo improved ability for flex and ER ROM of L hip, without pain, to improve ability for exercise and work duties.    Time  6    Period  Weeks    Status  New    Target Date  02/14/19      PT LONG TERM GOAL #3   Title  Pt to demo stability and NMC of R hip and knee to be [facility] for pt age, to improve pain and ambulation    Time  6    Period  Weeks    Status  New    Target Date  02/14/19      PT LONG TERM GOAL #4   Title  Pt to demo soft tissue restrictions in anterior, lateral, and posterior hip to be WNL, to decrease pain and maximize ROM.    Time  6    Period  Weeks    Status  New    Target Date  02/14/19            Plan - 01/10/19 1518    Clinical Impression Statement  Manual therapy done today for joint mobilization, IASTM and dry needling of L glute. Ther ex progressed for strength and stabilization of L hip. Pt with mild decrease in pain with deep hip flexion after session. Pt to benefit from continued manual therapy and progresion of strength.    Examination-Activity Limitations  Locomotion Level;Sit;Bend;Squat;Sleep;Stairs    Examination-Participation Restrictions  Cleaning;Community Activity    Stability/Clinical Decision Making  Stable/Uncomplicated    Rehab Potential  Good    PT Frequency  2x / week    PT Duration  6 weeks    PT Treatment/Interventions  ADLs/Self Care Home Management;Cryotherapy;Electrical Stimulation;DME Instruction;Ultrasound;Moist Heat;Iontophoresis 4mg /ml Dexamethasone;Gait training;Stair training;Functional mobility training;Therapeutic activities;Therapeutic exercise;Balance training;Orthotic Fit/Training;Patient/family education;Neuromuscular re-education;Manual techniques;Passive range of motion;Dry needling;Taping;Spinal Manipulations;Joint Manipulations    Consulted and Agree with Plan of Care  Patient       Patient will benefit from skilled therapeutic intervention in order to improve the  following deficits and  impairments:  Decreased range of motion, Difficulty walking, Increased muscle spasms, Decreased endurance, Decreased activity tolerance, Pain, Impaired flexibility, Hypomobility, Decreased mobility, Decreased strength  Visit Diagnosis: 1. Left hip pain   2. Stiffness of left hip, not elsewhere classified        Problem List Patient Active Problem List   Diagnosis Date Noted  . Chronic hypertension with superimposed preeclampsia 04/25/2014  . Blood transfusion during current hospitalization 04/25/2014  . Postpartum care following vaginal delivery (10/14) 04/24/2014  . Postpartum hemorrhage, postpartum condition 04/24/2014  . Oligohydramnios 04/22/2014  . Hereditary disease in family possibly affecting fetus, affecting management of mother, antepartum condition or complication 00/29/8473  . Abnormal findings on antenatal screening 11/27/2013    Lyndee Hensen, PT, DPT 3:21 PM  01/10/19    [place], [place], [address] Phone: [phone]   Fax:  [phone]  Name: Anna Washington MRN: [id number] Date of Birth: March 04, 1981

## 2019-01-16 ENCOUNTER — Encounter: Payer: 59 | Admitting: Physical Therapy

## 2019-01-17 ENCOUNTER — Ambulatory Visit (INDEPENDENT_AMBULATORY_CARE_PROVIDER_SITE_OTHER): Payer: 59 | Admitting: Physical Therapy

## 2019-01-17 ENCOUNTER — Encounter: Payer: Self-pay | Admitting: Physical Therapy

## 2019-01-17 ENCOUNTER — Other Ambulatory Visit: Payer: Self-pay

## 2019-01-17 DIAGNOSIS — G8929 Other chronic pain: Secondary | ICD-10-CM

## 2019-01-17 DIAGNOSIS — M25552 Pain in left hip: Secondary | ICD-10-CM

## 2019-01-17 DIAGNOSIS — M25652 Stiffness of left hip, not elsewhere classified: Secondary | ICD-10-CM | POA: Diagnosis not present

## 2019-01-17 DIAGNOSIS — M25562 Pain in left knee: Secondary | ICD-10-CM | POA: Diagnosis not present

## 2019-01-17 NOTE — Therapy (Addendum)
[facility] [address] [place], [place], [address] Phone: [phone]   Fax:  [phone]  Physical Therapy Treatment  Patient Details  Name: Anna Washington MRN: [id number] Date of Birth: [birth year] Referring Provider (PT): Erlinda Hong   Encounter Date: 01/17/2019  PT End of Session - 01/17/19 1318    Visit Number  3    Number of Visits  12    Date for PT Re-Evaluation  02/14/19    Authorization Type  UMR    PT Start Time  1222    PT Stop Time  1304    PT Time Calculation (min)  42 min    Activity Tolerance  Patient tolerated treatment well    Behavior During Therapy  [facility] for tasks assessed/performed       Past Medical History:  Diagnosis Date  . AMA (advanced maternal age) primigravida 45+   . Endometriosis   . Hypertension   . Newborn product of IVF pregnancy   . Postpartum care following vaginal delivery (10/14) 04/24/2014  . Postpartum hemorrhage, postpartum condition 04/24/2014    Past Surgical History:  Procedure Laterality Date  . ABLATION ON ENDOMETRIOSIS    . APPENDECTOMY    . DILATION AND EVACUATION N/A 04/23/2014   Procedure: DILATATION AND EVACUATION;  Surgeon: Lovenia Kim, MD;  Location: [facility];  Service: Gynecology;  Laterality: N/A;  . EXAMINATION UNDER ANESTHESIA N/A 04/23/2014   Procedure: EXAM UNDER ANESTHESIA placement of Bakri Balloon;  Surgeon: Lovenia Kim, MD;  Location: [facility];  Service: Gynecology;  Laterality: N/A;  . WISDOM TOOTH EXTRACTION      There were no vitals filed for this visit.  Subjective Assessment - 01/17/19 1316    Subjective  Pt states minimal soreness after last visit and DN. She states mild relief anteriorly with sitting, but reports pain at night this week. She has obtained new sneakers for work.    Currently in Pain?  Yes    Pain Score  5     Pain Location  Hip    Pain Orientation  Left    Pain Descriptors / Indicators  Aching    Pain Type  Acute pain    Pain Onset   More than a month ago    Pain Frequency  Intermittent                       [facility] Adult PT Treatment/Exercise - 01/17/19 0001      Exercises   Exercises  Knee/Hip      Knee/Hip Exercises: Stretches   Hip Flexor Stretch  --    Hip Flexor Stretch Limitations  --    ITB Stretch  3 reps;30 seconds;Both    ITB Stretch Limitations  thomas test posisiton    Piriformis Stretch  --    Piriformis Stretch Limitations  --      Knee/Hip Exercises: Standing   Hip Abduction  20 reps;Stengthening    Abduction Limitations  YTB    Hip Extension  20 reps;Stengthening    Extension Limitations  YTB    SLS  30 sec bil    SLS with Vectors  SLS with UE rotation x10 bil      Knee/Hip Exercises: Supine   Bridges  --    Other Supine Knee/Hip Exercises  --      Knee/Hip Exercises: Sidelying   Hip ABduction  --      Manual Therapy   Manual Therapy  Joint  mobilization;Soft tissue mobilization    Manual therapy comments  skilled palpation and monitoring of soft tissue during dry needling.     Joint Mobilization  Gr 3 hip mobs, all motions, with strap,     Soft tissue mobilization  DTM/IASTM to proximal quad and FL       Trigger Point Dry Needling - 01/17/19 0001    Consent Given?  Yes    Education Handout Provided  Previously provided    Muscles Treated Lower Quadrant  Rectus femoris    Muscles Treated Back/Hip  Tensor fascia lata    Rectus femoris Response  Palpable increased muscle length    Tensor Fascia Lata Response  Palpable increased muscle length           PT Education - 01/17/19 1317    Education Details  HEP reviewed    Person(s) Educated  Patient    Methods  Explanation    Comprehension  Verbalized understanding       PT Short Term Goals - 01/03/19 1227      PT SHORT TERM GOAL #1   Title  Pt to be independent with initial HEP    Time  2    Period  Weeks    Status  New    Target Date  01/17/19        PT Long Term Goals - 01/03/19 1235      PT  LONG TERM GOAL #1   Title  Pt to report decreased pain in L hip, to 0-2/10 with standing activity    Time  6    Period  Weeks    Status  New    Target Date  02/14/19      PT LONG TERM GOAL #2   Title  Pt to demo improved ability for flex and ER ROM of L hip, without pain, to improve ability for exercise and work duties.    Time  6    Period  Weeks    Status  New    Target Date  02/14/19      PT LONG TERM GOAL #3   Title  Pt to demo stability and NMC of R hip and knee to be [facility] for pt age, to improve pain and ambulation    Time  6    Period  Weeks    Status  New    Target Date  02/14/19      PT LONG TERM GOAL #4   Title  Pt to demo soft tissue restrictions in anterior, lateral, and posterior hip to be WNL, to decrease pain and maximize ROM.    Time  6    Period  Weeks    Status  New    Target Date  02/14/19            Plan - 01/17/19 1319    Clinical Impression Statement  IASTM and dry needling done today, anteriorly,for decreased pain. Pt states pinching pain in anterior hip with seated hip flexion, or with standing in full upright position, slight knee and hip flexion in standing decreases this. Pt with good tolerance for dry needling. progressed strength and stabilization in standing. Plan to progress as tolerated.    Examination-Activity Limitations  Locomotion Level;Sit;Bend;Squat;Sleep;Stairs    Examination-Participation Restrictions  Cleaning;Community Activity    Stability/Clinical Decision Making  Stable/Uncomplicated    Rehab Potential  Good    PT Frequency  2x / week    PT Duration  6 weeks  PT Treatment/Interventions  ADLs/Self Care Home Management;Cryotherapy;Electrical Stimulation;DME Instruction;Ultrasound;Moist Heat;Iontophoresis 11m/ml Dexamethasone;Gait training;Stair training;Functional mobility training;Therapeutic activities;Therapeutic exercise;Balance training;Orthotic Fit/Training;Patient/family education;Neuromuscular re-education;Manual  techniques;Passive range of motion;Dry needling;Taping;Spinal Manipulations;Joint Manipulations    Consulted and Agree with Plan of Care  Patient       Patient will benefit from skilled therapeutic intervention in order to improve the following deficits and impairments:  Decreased range of motion, Difficulty walking, Increased muscle spasms, Decreased endurance, Decreased activity tolerance, Pain, Impaired flexibility, Hypomobility, Decreased mobility, Decreased strength  Visit Diagnosis: 1. Left hip pain   2. Stiffness of left hip, not elsewhere classified   3. Chronic pain of left knee        Problem List Patient Active Problem List   Diagnosis Date Noted  . Chronic hypertension with superimposed preeclampsia 04/25/2014  . Blood transfusion during current hospitalization 04/25/2014  . Postpartum care following vaginal delivery (10/14) 04/24/2014  . Postpartum hemorrhage, postpartum condition 04/24/2014  . Oligohydramnios 04/22/2014  . Hereditary disease in family possibly affecting fetus, affecting management of mother, antepartum condition or complication 077/41/4239 . Abnormal findings on antenatal screening 11/27/2013    LLyndee Hensen PT, DPT 1:25 PM  01/17/19    [place] [address]Phone: [phone]  Fax:  [phone] Name: DZEINAB RODWELLMRN: [id number] of Birth: [birth year]  PHYSICAL THERAPY DISCHARGE SUMMARY  Visits from Start of Care:3  Plan: Patient agrees to discharge.  Patient goals were partially met. Patient is being discharged due to not returning since the last visit.  ?????    Pt went out of town, and was waiting to get MRI. Did not return to PT.   LLyndee Hensen PT, DPT 12:05 PM  03/25/19

## 2019-01-23 ENCOUNTER — Encounter: Payer: 59 | Admitting: Physical Therapy

## 2019-01-23 ENCOUNTER — Ambulatory Visit: Payer: 59 | Admitting: Orthopaedic Surgery

## 2019-01-24 ENCOUNTER — Encounter: Payer: 59 | Admitting: Physical Therapy

## 2019-01-30 ENCOUNTER — Ambulatory Visit (INDEPENDENT_AMBULATORY_CARE_PROVIDER_SITE_OTHER): Payer: 59 | Admitting: Family Medicine

## 2019-01-30 ENCOUNTER — Encounter: Payer: Self-pay | Admitting: Family Medicine

## 2019-01-30 ENCOUNTER — Other Ambulatory Visit: Payer: Self-pay

## 2019-01-30 ENCOUNTER — Encounter: Payer: 59 | Admitting: Family Medicine

## 2019-01-30 VITALS — BP 110/70 | HR 68 | Temp 98.6°F | Ht 67.0 in | Wt 128.0 lb

## 2019-01-30 DIAGNOSIS — Z13220 Encounter for screening for lipoid disorders: Secondary | ICD-10-CM

## 2019-01-30 DIAGNOSIS — Z1159 Encounter for screening for other viral diseases: Secondary | ICD-10-CM

## 2019-01-30 DIAGNOSIS — Z0000 Encounter for general adult medical examination without abnormal findings: Secondary | ICD-10-CM

## 2019-01-30 DIAGNOSIS — F419 Anxiety disorder, unspecified: Secondary | ICD-10-CM

## 2019-01-30 DIAGNOSIS — Z9189 Other specified personal risk factors, not elsewhere classified: Secondary | ICD-10-CM

## 2019-01-30 DIAGNOSIS — Z114 Encounter for screening for human immunodeficiency virus [HIV]: Secondary | ICD-10-CM | POA: Diagnosis not present

## 2019-01-30 MED ORDER — ALPRAZOLAM 0.25 MG PO TABS: 0.2500 mg | ORAL_TABLET | Freq: Two times a day (BID) | ORAL | 0 refills | Status: DC | PRN

## 2019-01-30 MED FILL — ALPRAZolam 0.25 MG TABS: 0.25 | 10 days supply | Qty: 20 | Fill #0

## 2019-01-30 NOTE — Progress Notes (Signed)
Subjective:    Patient ID: Anna Washington, female    DOB: Aug 17, 1980, 38 y.o.   MRN: [id number]  HPI Chief Complaint  Patient presents with  . cpe    fasting cpe, has upcoming obygn appt, no other concerns   She is here for a complete physical exam. She also reports a history of anxiety in certain situations including flying and public speaking.  States she takes alprazolam sparingly for these situations.  States very rarely she will take alprazolam for sleep.  Requests a prescription.  States she is no longer getting alprazolam from any other provider and she ran out approximately 1 month ago.  States she does not have a history of addiction.  States she is aware of the potential sedating side effects and avoids alcohol with the medication.  Other providers: OB/GYN- Dr. Ronita Hipps Orthopedist- Dr. Erlinda Hong  Hx of endometriosis  Mirena IUD Has appt with Dr. Ronita Hipps.   Reports occasionally she will spit a small amount of blood and is certain that it is coming from her gums.  History of a crown.  States she has seen her dentist.  No coughing or vomiting.  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 She is married and her husband also works in Corporate treasurer.  She has a 13-1/2-year-old. Denies smoking, drug use. She drinks alcohol occasionally.  Diet: healthy and no meat except for seafood  Caffeine only coffee in the morning.  Excerise: yoga   Immunizations: [employer] employee   Health maintenance:  Mammogram: never  Colonoscopy: never  Last Gynecological Exam: one year ago  Last Menstrual cycle: none since IUD  Last Dental Exam: 08/2018 Last Eye Exam: 06/2018  Wears seatbelt always, uses sunscreen, smoke detectors in home and functioning, does not text while driving and feels safe in home environment.   Reviewed allergies, medications, past medical, surgical, family, and social history.     Review of Systems Review of Systems Constitutional: -fever, -chills, -sweats,  -unexpected weight change,-fatigue ENT: -runny nose, -ear pain, -sore throat Cardiology:  -chest pain, -palpitations, -edema Respiratory: -cough, -shortness of breath, -wheezing Gastroenterology: -abdominal pain, -nausea, -vomiting, -diarrhea, -constipation  Hematology: -bleeding or bruising problems Musculoskeletal: -arthralgias, -myalgias, -joint swelling, -back pain Ophthalmology: -vision changes Urology: -dysuria, -difficulty urinating, -hematuria, -urinary frequency, -urgency Neurology: -headache, -weakness, -tingling, -numbness       Objective:   Physical Exam BP 110/70   Pulse 68   Temp 98.6 F (37 C) (Oral)   Ht 5\' 7"  (1.702 m)   Wt 128 lb (58.1 kg)   SpO2 98%   BMI 20.05 kg/m   General Appearance:    Alert, cooperative, no distress, appears stated age  Head:    Normocephalic, without obvious abnormality, atraumatic  Eyes:    PERRL, conjunctiva/corneas clear, EOM's intact, fundi    benign  Ears:    Normal TM's and external ear canals  Nose:   Nares normal, mucosa normal, no drainage or sinus   tenderness  Throat:   Lips, mucosa, and tongue normal; teeth and gums normal  Neck:   Supple, no lymphadenopathy;  thyroid:  no   enlargement/tenderness/nodules; no carotid   bruit or JVD  Back:    Spine nontender, no curvature, ROM normal, no CVA     tenderness  Lungs:     Clear to auscultation bilaterally without wheezes, rales or     ronchi; respirations unlabored  Chest Wall:    No tenderness or deformity   Heart:  Regular rate and rhythm, S1 and S2 normal, no murmur, rub   or gallop  Breast Exam:   OB/GYN  Abdomen:     Soft, non-tender, nondistended, normoactive bowel sounds,    no masses, no hepatosplenomegaly  Genitalia:   OB/GYN  Rectal:    Not performed due to age<40 and no related complaints  Extremities:   No clubbing, cyanosis or edema  Pulses:   2+ and symmetric all extremities  Skin:   Skin color, texture, turgor normal, no rashes or lesions  Lymph nodes:    Cervical, supraclavicular, and axillary nodes normal  Neurologic:   CNII-XII intact, normal strength, sensation and gait; reflexes 2+ and symmetric throughout          Psych:   Normal mood, affect, hygiene and grooming.           Assessment & Plan:  Routine general medical examination at a health care facility - Plan: CBC with Differential/Platelet, Comprehensive metabolic panel, TSH, T4, free, Lipid panel, she appears to be doing well overall.  Taking good care of herself.  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  Discussed safety and health promotion.  She will follow-up with her OB/GYN as scheduled for breast and pelvic exams.  Anxiety - Plan: ALPRAZolam (XANAX) 0.25 MG tablet, discussed that she now will have a verbal contract that she will not get a prescription for alprazolam from any other providers.  Since she is using this sparingly, I will give her a prescription for this.  Discussed potential side effects and that this is not a cure for anxiety.  Recommend counseling.  Screening for lipid disorders - Plan: Lipid panel, further details she has a fairly low-cholesterol diet.  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

## 2019-01-30 NOTE — Patient Instructions (Signed)
Preventive Care 21-39 Years Old, Female Preventive care refers to visits with your health care provider and lifestyle choices that can promote health and wellness. This includes:  A yearly physical exam. This may also be called an annual well check.  Regular dental visits and eye exams.  Immunizations.  Screening for certain conditions.  Healthy lifestyle choices, such as eating a healthy diet, getting regular exercise, not using drugs or products that contain nicotine and tobacco, and limiting alcohol use. What can I expect for my preventive care visit? Physical exam Your health care provider will check your:  Height and weight. This may be used to calculate body mass index (BMI), which tells if you are at a healthy weight.  Heart rate and blood pressure.  Skin for abnormal spots. Counseling Your health care provider may ask you questions about your:  Alcohol, tobacco, and drug use.  Emotional well-being.  Home and relationship well-being.  Sexual activity.  Eating habits.  Work and work environment.  Method of birth control.  Menstrual cycle.  Pregnancy history. What immunizations do I need?  Influenza (flu) vaccine  This is recommended every year. Tetanus, diphtheria, and pertussis (Tdap) vaccine  You may need a Td booster every 10 years. Varicella (chickenpox) vaccine  You may need this if you have not been vaccinated. Human papillomavirus (HPV) vaccine  If recommended by your health care provider, you may need three doses over 6 months. Measles, mumps, and rubella (MMR) vaccine  You may need at least one dose of MMR. You may also need a second dose. Meningococcal conjugate (MenACWY) vaccine  One dose is recommended if you are age 19-21 years and a first-year college student living in a residence hall, or if you have one of several medical conditions. You may also need additional booster doses. Pneumococcal conjugate (PCV13) vaccine  You may need  this if you have certain conditions and were not previously vaccinated. Pneumococcal polysaccharide (PPSV23) vaccine  You may need one or two doses if you smoke cigarettes or if you have certain conditions. Hepatitis A vaccine  You may need this if you have certain conditions or if you travel or work in places where you may be exposed to hepatitis A. Hepatitis B vaccine  You may need this if you have certain conditions or if you travel or work in places where you may be exposed to hepatitis B. Haemophilus influenzae type b (Hib) vaccine  You may need this if you have certain conditions. You may receive vaccines as individual doses or as more than one vaccine together in one shot (combination vaccines). Talk with your health care provider about the risks and benefits of combination vaccines. What tests do I need?  Blood tests  Lipid and cholesterol levels. These may be checked every 5 years starting at age 20.  Hepatitis C test.  Hepatitis B test. Screening  Diabetes screening. This is done by checking your blood sugar (glucose) after you have not eaten for a while (fasting).  Sexually transmitted disease (STD) testing.  BRCA-related cancer screening. This may be done if you have a family history of breast, ovarian, tubal, or peritoneal cancers.  Pelvic exam and Pap test. This may be done every 3 years starting at age 21. Starting at age 30, this may be done every 5 years if you have a Pap test in combination with an HPV test. Talk with your health care provider about your test results, treatment options, and if necessary, the need for more tests.   Follow these instructions at home: Eating and drinking   Eat a diet that includes fresh fruits and vegetables, whole grains, lean protein, and low-fat dairy.  Take vitamin and mineral supplements as recommended by your health care provider.  Do not drink alcohol if: ? Your health care provider tells you not to drink. ? You are  pregnant, may be pregnant, or are planning to become pregnant.  If you drink alcohol: ? Limit how much you have to 0-1 drink a day. ? Be aware of how much alcohol is in your drink. In the [place] one drink equals one 12 oz bottle of beer (355 mL), one 5 oz glass of wine (148 mL), or one 1 oz glass of hard liquor (44 mL). Lifestyle  Take daily care of your teeth and gums.  Stay active. Exercise for at least 30 minutes on 5 or more days each week.  Do not use any products that contain nicotine or tobacco, such as cigarettes, e-cigarettes, and chewing tobacco. If you need help quitting, ask your health care provider.  If you are sexually active, practice safe sex. Use a condom or other form of birth control (contraception) in order to prevent pregnancy and STIs (sexually transmitted infections). If you plan to become pregnant, see your health care provider for a preconception visit. What's next?  Visit your health care provider once a year for a well check visit.  Ask your health care provider how often you should have your eyes and teeth checked.  Stay up to date on all vaccines. This information is not intended to replace advice given to you by your health care provider. Make sure you discuss any questions you have with your health care provider. Document Released: 08/23/2001 Document Revised: 03/08/2018 Document Reviewed: 03/08/2018 Elsevier Patient Education  2020 [employer]  

## 2019-01-31 LAB — COMPREHENSIVE METABOLIC PANEL
ALT: 10 IU/L (ref 0–32)
AST: 16 IU/L (ref 0–40)
Albumin/Globulin Ratio: 2.1 (ref 1.2–2.2)
Albumin: 4.6 g/dL (ref 3.8–4.8)
Alkaline Phosphatase: 46 IU/L (ref 39–117)
BUN/Creatinine Ratio: 10 (ref 9–23)
BUN: 7 mg/dL (ref 6–20)
Bilirubin Total: 0.5 mg/dL (ref 0.0–1.2)
CO2: 24 mmol/L (ref 20–29)
Calcium: 9.7 mg/dL (ref 8.7–10.2)
Chloride: 97 mmol/L (ref 96–106)
Creatinine, Ser: 0.72 mg/dL (ref 0.57–1.00)
GFR calc Af Amer: 122 mL/min/{1.73_m2} (ref >59)
GFR calc non Af Amer: 106 mL/min/{1.73_m2} (ref >59)
Globulin, Total: 2.2 g/dL (ref 1.5–4.5)
Glucose: 96 mg/dL (ref 65–99)
Potassium: 4 mmol/L (ref 3.5–5.2)
Sodium: 139 mmol/L (ref 134–144)
Total Protein: 6.8 g/dL (ref 6.0–8.5)

## 2019-01-31 LAB — TSH: TSH: 1.51 u[IU]/mL (ref 0.450–4.500)

## 2019-01-31 LAB — HIV ANTIBODY (ROUTINE TESTING W REFLEX): HIV Screen 4th Generation wRfx: NONREACTIVE

## 2019-01-31 LAB — CBC WITH DIFFERENTIAL/PLATELET
Basophils Absolute: 0 10*3/uL (ref 0.0–0.2)
Basos: 1 %
EOS (ABSOLUTE): 0.1 10*3/uL (ref 0.0–0.4)
Eos: 1 %
Hematocrit: 36.8 % (ref 34.0–46.6)
Hemoglobin: 12.7 g/dL (ref 11.1–15.9)
Immature Grans (Abs): 0 10*3/uL (ref 0.0–0.1)
Immature Granulocytes: 0 %
Lymphocytes Absolute: 2 10*3/uL (ref 0.7–3.1)
Lymphs: 31 %
MCH: 30.6 pg (ref 26.6–33.0)
MCHC: 34.5 g/dL (ref 31.5–35.7)
MCV: 89 fL (ref 79–97)
Monocytes Absolute: 0.5 10*3/uL (ref 0.1–0.9)
Monocytes: 8 %
Neutrophils Absolute: 3.8 10*3/uL (ref 1.4–7.0)
Neutrophils: 59 %
Platelets: 286 10*3/uL (ref 150–450)
RBC: 4.15 x10E6/uL (ref 3.77–5.28)
RDW: 11.6 % — ABNORMAL LOW (ref 11.7–15.4)
WBC: 6.4 10*3/uL (ref 3.4–10.8)

## 2019-01-31 LAB — LIPID PANEL
Chol/HDL Ratio: 2.4 ratio (ref 0.0–4.4)
Cholesterol, Total: 169 mg/dL (ref 100–199)
HDL: 70 mg/dL (ref >39)
LDL Calculated: 90 mg/dL (ref 0–99)
Triglycerides: 43 mg/dL (ref 0–149)
VLDL Cholesterol Cal: 9 mg/dL (ref 5–40)

## 2019-01-31 LAB — HEPATITIS C ANTIBODY: Hep C Virus Ab: <0.1 s/co ratio (ref 0.0–0.9)

## 2019-01-31 LAB — T4, FREE: Free T4: 1.35 ng/dL (ref 0.82–1.77)

## 2019-02-01 ENCOUNTER — Inpatient Hospital Stay: Admission: RE | Admit: 2019-02-01 | Payer: 59 | Source: Ambulatory Visit

## 2019-02-01 ENCOUNTER — Other Ambulatory Visit: Payer: 59

## 2019-02-08 MED FILL — ALPRAZolam 0.25 MG TABS: 0.25 | 10 days supply | Qty: 20 | Fill #0

## 2019-02-13 ENCOUNTER — Ambulatory Visit: Payer: 59 | Admitting: Orthopaedic Surgery

## 2019-03-21 DIAGNOSIS — Z681 Body mass index (BMI) 19 or less, adult: Secondary | ICD-10-CM | POA: Diagnosis not present

## 2019-03-21 DIAGNOSIS — Z01419 Encounter for gynecological examination (general) (routine) without abnormal findings: Secondary | ICD-10-CM | POA: Diagnosis not present

## 2019-03-21 MED FILL — AMOXICILLIN 500 MG CAPSULE: 500 | 7 days supply | Qty: 21 | Fill #0

## 2019-03-28 DIAGNOSIS — Z1231 Encounter for screening mammogram for malignant neoplasm of breast: Secondary | ICD-10-CM | POA: Diagnosis not present

## 2019-11-14 ENCOUNTER — Ambulatory Visit: Payer: 59 | Admitting: Family Medicine

## 2019-11-15 ENCOUNTER — Other Ambulatory Visit: Payer: Self-pay

## 2019-11-15 ENCOUNTER — Encounter: Payer: Self-pay | Admitting: Medical

## 2019-11-15 ENCOUNTER — Ambulatory Visit: Payer: 59 | Admitting: Medical

## 2019-11-15 VITALS — BP 120/84 | HR 98 | Temp 98.0°F | Ht 67.0 in | Wt 124.0 lb

## 2019-11-15 DIAGNOSIS — F419 Anxiety disorder, unspecified: Secondary | ICD-10-CM

## 2019-11-15 DIAGNOSIS — M26609 Unspecified temporomandibular joint disorder, unspecified side: Secondary | ICD-10-CM

## 2019-11-15 MED ORDER — ALPRAZOLAM 0.25 MG PO TABS: 0.2500 mg | ORAL_TABLET | Freq: Two times a day (BID) | ORAL | 0 refills | Status: DC | PRN

## 2019-11-15 MED ORDER — TIZANIDINE HCL 4 MG PO TABS: 4.0000 mg | ORAL_TABLET | Freq: Two times a day (BID) | ORAL | 0 refills | Status: DC | PRN

## 2019-11-15 MED FILL — ALPRAZolam 0.25 MG TABS: 0.25 | 10 days supply | Qty: 20 | Fill #0

## 2019-11-15 MED FILL — tiZANidine HCL 4 MG TABS: 4 | 15 days supply | Qty: 30 | Fill #0

## 2019-11-15 NOTE — Patient Instructions (Addendum)

## 2019-11-15 NOTE — Progress Notes (Signed)
Subjective:  Anna Washington is a 39 y.o. female who presents for Chief Complaint  Patient presents with  . Jaw Pain    right      Here for right jaw pain x 2 weeks, not improving.   Is constant, daily, all the time, particular with eating or chewing.   There is constant ache.   No trauma, no injury.  Has allergy issues, but not necessarily sick.  Has had some sinus headache, but no other sinus symptoms.   Has seen dentist in last 6 months.  Has had some unusual bleeding in mouth.  Sound endodontist for this, think there is an infection within crown.  This is on the right.   No fever.  Using some ibuprofen for this.  No other aggravating or relieving factors.    No concern for pregnancy.  Has IUD in place  No other c/o.  Past Medical History:  Diagnosis Date  . AMA (advanced maternal age) primigravida 29+   . Endometriosis   . Hypertension   . Newborn product of IVF pregnancy   . Postpartum care following vaginal delivery (10/14) 04/24/2014  . Postpartum hemorrhage, postpartum condition 04/24/2014   Current Outpatient Medications on File Prior to Visit  Medication Sig Dispense Refill  . cetirizine (ZYRTEC) 10 MG tablet Take by mouth.     No current facility-administered medications on file prior to visit.     The following portions of the patient's history were reviewed and updated as appropriate: allergies, current medications, past family history, past medical history, past social history, past surgical history and problem list.  ROS Otherwise as in subjective above  Objective: BP 120/84   Pulse 98   Temp 98 F (36.7 C)   Ht 5\' 7"  (1.702 m)   Wt 124 lb (56.2 kg)   SpO2 98%   BMI 19.42 kg/m    Wt Readings from Last 3 Encounters:  11/15/19 124 lb (56.2 kg)  01/30/19 128 lb (58.1 kg)  12/28/18 124 lb (56.2 kg)    General appearance: alert, no distress, well developed, well nourished, lean white female HEENT: tender over right TMJ with slight click felt with jaw  closing, otherwise normocephalic, sclerae anicteric, conjunctiva pink and moist, TMs pearly, nares patent, no discharge or erythema, pharynx normal Oral cavity: MMM, no lesions, no obvious tooth lesion Neck: supple, no lymphadenopathy, no thyromegaly, no masses    Assessment: Encounter Diagnoses  Name Primary?  . TMJ (temporomandibular joint syndrome) Yes  . Anxiety      Plan: TMJ -discussed symptoms and concerns.  Discussed general rest, can do cool pack ice pack 20 minutes twice daily, begin Naprosyn daily in the morning, muscle relaxer as below at nighttime for the next 4 to 5 days then as needed.  Try using softer or pureed foods for the short-term to minimize chewing.  If not much improved within the next 2 weeks call or return  Anxiety - more stress lately related to home remodeling project.   Refilled xanax short term prn use at her request  Anna Washington was seen today for jaw pain.  Diagnoses and all orders for this visit:  TMJ (temporomandibular joint syndrome)  Anxiety -     ALPRAZolam (XANAX) 0.25 MG tablet; Take 1 tablet (0.25 mg total) by mouth 2 (two) times daily as needed for anxiety.  Other orders -     tiZANidine (ZANAFLEX) 4 MG tablet; Take 1 tablet (4 mg total) by mouth 2 (two) times daily as needed  for muscle spasms. TMJ    Follow up: 3-4 weeks with [name]

## 2020-02-13 ENCOUNTER — Encounter: Payer: Self-pay | Admitting: Family Medicine

## 2020-02-13 ENCOUNTER — Other Ambulatory Visit: Payer: Self-pay

## 2020-02-13 ENCOUNTER — Ambulatory Visit: Payer: 59 | Admitting: Family Medicine

## 2020-02-13 VITALS — BP 102/68 | HR 66 | Temp 97.9°F | Ht 67.0 in | Wt 126.8 lb

## 2020-02-13 DIAGNOSIS — Z0000 Encounter for general adult medical examination without abnormal findings: Secondary | ICD-10-CM | POA: Diagnosis not present

## 2020-02-13 DIAGNOSIS — F419 Anxiety disorder, unspecified: Secondary | ICD-10-CM

## 2020-02-13 DIAGNOSIS — N809 Endometriosis, unspecified: Secondary | ICD-10-CM | POA: Insufficient documentation

## 2020-02-13 DIAGNOSIS — Z975 Presence of (intrauterine) contraceptive device: Secondary | ICD-10-CM | POA: Insufficient documentation

## 2020-02-13 DIAGNOSIS — F418 Other specified anxiety disorders: Secondary | ICD-10-CM | POA: Diagnosis not present

## 2020-02-13 LAB — LIPID PANEL
Cholesterol: 196 mg/dL (ref <200)
HDL: 67 mg/dL (ref >=50)
LDL Cholesterol (Calc): 117 mg/dL (calc) — ABNORMAL HIGH
Non-HDL Cholesterol (Calc): 129 mg/dL (calc) (ref <130)
Total CHOL/HDL Ratio: 2.9 (calc) (ref <5.0)
Triglycerides: 41 mg/dL (ref <150)

## 2020-02-13 LAB — COMPREHENSIVE METABOLIC PANEL
AG Ratio: 1.8 (calc) (ref 1.0–2.5)
ALT: 14 U/L (ref 6–29)
AST: 18 U/L (ref 10–30)
Albumin: 4.6 g/dL (ref 3.6–5.1)
Alkaline phosphatase (APISO): 47 U/L (ref 31–125)
BUN: 12 mg/dL (ref 7–25)
CO2: 30 mmol/L (ref 20–32)
Calcium: 9.5 mg/dL (ref 8.6–10.2)
Chloride: 102 mmol/L (ref 98–110)
Creat: 0.7 mg/dL (ref 0.50–1.10)
Globulin: 2.5 g/dL (calc) (ref 1.9–3.7)
Glucose, Bld: 88 mg/dL (ref 65–99)
Potassium: 4.3 mmol/L (ref 3.5–5.3)
Sodium: 137 mmol/L (ref 135–146)
Total Bilirubin: 0.8 mg/dL (ref 0.2–1.2)
Total Protein: 7.1 g/dL (ref 6.1–8.1)

## 2020-02-13 LAB — CBC WITH DIFFERENTIAL/PLATELET
Absolute Monocytes: 459 cells/uL (ref 200–950)
Basophils Absolute: 22 cells/uL (ref 0–200)
Basophils Relative: 0.4 %
Eosinophils Absolute: 59 cells/uL (ref 15–500)
Eosinophils Relative: 1.1 %
HCT: 38.8 % (ref 35.0–45.0)
Hemoglobin: 13.2 g/dL (ref 11.7–15.5)
Lymphs Abs: 1372 cells/uL (ref 850–3900)
MCH: 30.8 pg (ref 27.0–33.0)
MCHC: 34 g/dL (ref 32.0–36.0)
MCV: 90.7 fL (ref 80.0–100.0)
MPV: 9.8 fL (ref 7.5–12.5)
Monocytes Relative: 8.5 %
Neutro Abs: 3488 cells/uL (ref 1500–7800)
Neutrophils Relative %: 64.6 %
Platelets: 284 10*3/uL (ref 140–400)
RBC: 4.28 10*6/uL (ref 3.80–5.10)
RDW: 11.7 % (ref 11.0–15.0)
Total Lymphocyte: 25.4 %
WBC: 5.4 10*3/uL (ref 3.8–10.8)

## 2020-02-13 LAB — TSH: TSH: 1.08 mIU/L

## 2020-02-13 MED ORDER — ALPRAZOLAM 0.25 MG PO TABS: 0.2500 mg | ORAL_TABLET | Freq: Two times a day (BID) | ORAL | 0 refills | Status: DC | PRN

## 2020-02-13 MED FILL — ALPRAZolam 0.25 MG TABS: 0.25 | 10 days supply | Qty: 20 | Fill #0

## 2020-02-13 NOTE — Progress Notes (Signed)
Patient: Anna Washington MRN: [id number] DOB: 26-Oct-1980 PCP: Girtha Rm, NP-C     Subjective:  Chief Complaint  Patient presents with  . Annual Exam    HPI: The patient is a 39 y.o. female who presents today for annual exam. She denies any changes to past medical history. There have been no recent hospitalizations. She is following a well balanced diet and exercise plan. She does yoga, walks, and runs. Weight has been fluctuating a bit. She complains that a mole has changed on her right leg. She also would like to discuss referral for a new GYN. She has Endometriosis and has some pain due to IUD.   No family history of first degree relative with colon or breast cancer. Maternal grandmother had breast cancer. Paternal grandmother also had breast cancer.   Immunization History  Administered Date(s) Administered  . Tdap 03/06/2014     Colonoscopy: routine screening  Mammogram: 03/28/2019 Pap smear: 03/21/2019   Review of Systems  Constitutional: Negative for chills, fatigue and fever.  HENT: Negative for dental problem, ear pain, hearing loss and trouble swallowing.   Eyes: Negative for visual disturbance.  Respiratory: Negative for cough, chest tightness and shortness of breath.   Cardiovascular: Negative for chest pain, palpitations and leg swelling.  Gastrointestinal: Negative for abdominal pain, blood in stool, diarrhea, nausea and vomiting.  Endocrine: Negative for cold intolerance, polydipsia, polyphagia and polyuria.  Genitourinary: Positive for pelvic pain. Negative for dysuria, frequency, hematuria and urgency.  Musculoskeletal: Negative for arthralgias.  Skin: Negative for rash.  Neurological: Positive for headaches. Negative for dizziness and light-headedness.  Psychiatric/Behavioral: Negative for dysphoric mood and sleep disturbance. The patient is not nervous/anxious.     Allergies Patient is allergic to penicillins.  Past Medical History Patient  has a past  medical history of AMA (advanced maternal age) primigravida 53+, Endometriosis, Hypertension, Newborn product of IVF pregnancy, Postpartum care following vaginal delivery (10/14) (04/24/2014), and Postpartum hemorrhage, postpartum condition (04/24/2014).  Surgical History Patient  has a past surgical history that includes Appendectomy; Wisdom tooth extraction; Ablation on endometriosis; Examination under anesthesia (N/A, 04/23/2014); and Dilation and evacuation (N/A, 04/23/2014).  Family History Pateint's family history includes Cancer in her paternal grandfather; Cancer (age of onset: 16) in her maternal grandmother; Diabetes in her paternal grandfather and paternal grandmother; Heart attack (age of onset: 18) in her maternal grandfather; Hyperlipidemia in her father.  Social History Patient  reports that she has never smoked. She has never used smokeless tobacco. She reports that she does not drink alcohol and does not use drugs.    Objective: Vitals:   02/13/20 0940  BP: 102/68  Pulse: 66  Temp: 97.9 F (36.6 C)  TempSrc: Temporal  SpO2: 98%  Weight: 126 lb 12.8 oz (57.5 kg)  Height: 5\' 7"  (1.702 m)    Body mass index is 19.86 kg/m.  Physical Exam Vitals reviewed.  Constitutional:      Appearance: Normal appearance. She is well-developed and normal weight.  HENT:     Head: Normocephalic and atraumatic.     Right Ear: Tympanic membrane, ear canal and external ear normal.     Left Ear: Tympanic membrane, ear canal and external ear normal.     Nose: Nose normal.     Mouth/Throat:     Mouth: Mucous membranes are moist.  Eyes:     Conjunctiva/sclera: Conjunctivae normal.     Pupils: Pupils are equal, round, and reactive to light.  Neck:     Thyroid:  No thyromegaly.  Cardiovascular:     Rate and Rhythm: Normal rate and regular rhythm.     Pulses: Normal pulses.     Heart sounds: Normal heart sounds. No murmur heard.   Pulmonary:     Effort: Pulmonary effort is normal.      Breath sounds: Normal breath sounds.  Abdominal:     General: Abdomen is flat. Bowel sounds are normal. There is no distension.     Palpations: Abdomen is soft.     Tenderness: There is no abdominal tenderness.  Musculoskeletal:     Cervical back: Normal range of motion and neck supple.  Lymphadenopathy:     Cervical: No cervical adenopathy.  Skin:    General: Skin is warm and dry.     Capillary Refill: Capillary refill takes less than 2 seconds.     Findings: No rash.  Neurological:     General: No focal deficit present.     Mental Status: She is alert and oriented to person, place, and time.     Cranial Nerves: No cranial nerve deficit.     Coordination: Coordination normal.     Deep Tendon Reflexes: Reflexes normal.  Psychiatric:        Mood and Affect: Mood normal.        Behavior: Behavior normal.      Office Visit from 01/30/2019 in [facility]  PHQ-2 Total Score 0         Assessment/plan: 1. Annual physical exam Routine fasting labs today. HM UTD. Healthy and active. Continue healthy lifestyle. Discussed GYN in area. Recommendations given.  F/u in one year or as needed.  Patient counseling [x]    Nutrition: Stressed importance of moderation in sodium/caffeine intake, saturated fat and cholesterol, caloric balance, sufficient intake of fresh fruits, vegetables, fiber, calcium, iron, and 1 mg of folate supplement per day (for females capable of pregnancy).  [x]    Stressed the importance of regular exercise.   []    Substance Abuse: Discussed cessation/primary prevention of tobacco, alcohol, or other drug use; driving or other dangerous activities under the influence; availability of treatment for abuse.   [x]    Injury prevention: Discussed safety belts, safety helmets, smoke detector, smoking near bedding or upholstery.   [x]    Sexuality: Discussed sexually transmitted diseases, partner selection, use of condoms, avoidance of unintended pregnancy  and  contraceptive alternatives.  [x]    Dental health: Discussed importance of regular tooth brushing, flossing, and dental visits.  [x]    Health maintenance and immunizations reviewed. Please refer to Health maintenance section.     2. Situational anxiety Takes very rarely a xanax. 20 pills has lasted nearly a year. pmp website reviewed and refills given.      This visit occurred during the SARS-CoV-2 public health emergency.  Safety protocols were in place, including screening questions prior to the visit, additional usage of staff PPE, and extensive cleaning of exam room while observing appropriate contact time as indicated for disinfecting solutions.     Return in about 1 year (around 02/12/2021) for annual .     Orma Flaming, MD [facility]  02/13/2020

## 2020-02-13 NOTE — Patient Instructions (Signed)
Refilled xanax for you! You look great and it's so nice to meet you!  Aw   Preventive Care 77-41 Years Old, Female Preventive care refers to visits with your health care provider and lifestyle choices that can promote health and wellness. This includes:  A yearly physical exam. This may also be called an annual well check.  Regular dental visits and eye exams.  Immunizations.  Screening for certain conditions.  Healthy lifestyle choices, such as eating a healthy diet, getting regular exercise, not using drugs or products that contain nicotine and tobacco, and limiting alcohol use. What can I expect for my preventive care visit? Physical exam Your health care provider will check your:  Height and weight. This may be used to calculate body mass index (BMI), which tells if you are at a healthy weight.  Heart rate and blood pressure.  Skin for abnormal spots. Counseling Your health care provider may ask you questions about your:  Alcohol, tobacco, and drug use.  Emotional well-being.  Home and relationship well-being.  Sexual activity.  Eating habits.  Work and work Statistician.  Method of birth control.  Menstrual cycle.  Pregnancy history. What immunizations do I need?  Influenza (flu) vaccine  This is recommended every year. Tetanus, diphtheria, and pertussis (Tdap) vaccine  You may need a Td booster every 10 years. Varicella (chickenpox) vaccine  You may need this if you have not been vaccinated. Zoster (shingles) vaccine  You may need this after age 11. Measles, mumps, and rubella (MMR) vaccine  You may need at least one dose of MMR if you were born in 1957 or later. You may also need a second dose. Pneumococcal conjugate (PCV13) vaccine  You may need this if you have certain conditions and were not previously vaccinated. Pneumococcal polysaccharide (PPSV23) vaccine  You may need one or two doses if you smoke cigarettes or if you have certain  conditions. Meningococcal conjugate (MenACWY) vaccine  You may need this if you have certain conditions. Hepatitis A vaccine  You may need this if you have certain conditions or if you travel or work in places where you may be exposed to hepatitis A. Hepatitis B vaccine  You may need this if you have certain conditions or if you travel or work in places where you may be exposed to hepatitis B. Haemophilus influenzae type b (Hib) vaccine  You may need this if you have certain conditions. Human papillomavirus (HPV) vaccine  If recommended by your health care provider, you may need three doses over 6 months. You may receive vaccines as individual doses or as more than one vaccine together in one shot (combination vaccines). Talk with your health care provider about the risks and benefits of combination vaccines. What tests do I need? Blood tests  Lipid and cholesterol levels. These may be checked every 5 years, or more frequently if you are over 52 years old.  Hepatitis C test.  Hepatitis B test. Screening  Lung cancer screening. You may have this screening every year starting at age 59 if you have a 30-pack-year history of smoking and currently smoke or have quit within the past 15 years.  Colorectal cancer screening. All adults should have this screening starting at age 64 and continuing until age 68. Your health care provider may recommend screening at age 45 if you are at increased risk. You will have tests every 1-10 years, depending on your results and the type of screening test.  Diabetes screening. This is done by  checking your blood sugar (glucose) after you have not eaten for a while (fasting). You may have this done every 1-3 years.  Mammogram. This may be done every 1-2 years. Talk with your health care provider about when you should start having regular mammograms. This may depend on whether you have a family history of breast cancer.  BRCA-related cancer screening. This  may be done if you have a family history of breast, ovarian, tubal, or peritoneal cancers.  Pelvic exam and Pap test. This may be done every 3 years starting at age 3. Starting at age 76, this may be done every 5 years if you have a Pap test in combination with an HPV test. Other tests  Sexually transmitted disease (STD) testing.  Bone density scan. This is done to screen for osteoporosis. You may have this scan if you are at high risk for osteoporosis. Follow these instructions at home: Eating and drinking  Eat a diet that includes fresh fruits and vegetables, whole grains, lean protein, and low-fat dairy.  Take vitamin and mineral supplements as recommended by your health care provider.  Do not drink alcohol if: ? Your health care provider tells you not to drink. ? You are pregnant, may be pregnant, or are planning to become pregnant.  If you drink alcohol: ? Limit how much you have to 0-1 drink a day. ? Be aware of how much alcohol is in your drink. In the [place] one drink equals one 12 oz bottle of beer (355 mL), one 5 oz glass of wine (148 mL), or one 1 oz glass of hard liquor (44 mL). Lifestyle  Take daily care of your teeth and gums.  Stay active. Exercise for at least 30 minutes on 5 or more days each week.  Do not use any products that contain nicotine or tobacco, such as cigarettes, e-cigarettes, and chewing tobacco. If you need help quitting, ask your health care provider.  If you are sexually active, practice safe sex. Use a condom or other form of birth control (contraception) in order to prevent pregnancy and STIs (sexually transmitted infections).  If told by your health care provider, take low-dose aspirin daily starting at age 90. What's next?  Visit your health care provider once a year for a well check visit.  Ask your health care provider how often you should have your eyes and teeth checked.  Stay up to date on all vaccines. This information is not  intended to replace advice given to you by your health care provider. Make sure you discuss any questions you have with your health care provider. Document Revised: 03/08/2018 Document Reviewed: 03/08/2018 Elsevier Patient Education  2020 [employer].

## 2020-03-25 ENCOUNTER — Other Ambulatory Visit: Payer: 59

## 2020-05-21 ENCOUNTER — Ambulatory Visit: Payer: 59 | Attending: Internal Medicine

## 2020-05-21 DIAGNOSIS — Z23 Encounter for immunization: Secondary | ICD-10-CM

## 2020-05-21 NOTE — Progress Notes (Signed)
   Covid-19 Vaccination Clinic  Name:  Anna Washington    MRN: [id number] DOB: [birth year]  05/21/2020  Anna Washington was observed post Covid-19 immunization for 15 minutes without incident. She was provided with Vaccine Information Sheet and instruction to access the V-Safe system.   Anna Washington was instructed to call 911 with any severe reactions post vaccine: Marland Kitchen Difficulty breathing  . Swelling of face and throat  . A fast heartbeat  . A bad rash all over body  . Dizziness and weakness

## 2020-06-17 ENCOUNTER — Encounter: Payer: Self-pay | Admitting: Obstetrics and Gynecology

## 2020-06-17 ENCOUNTER — Other Ambulatory Visit: Payer: Self-pay

## 2020-06-17 ENCOUNTER — Other Ambulatory Visit (HOSPITAL_COMMUNITY)
Admission: RE | Admit: 2020-06-17 | Discharge: 2020-06-17 | Disposition: A | Discharge status: Home / Self Care | Payer: 59 | Source: Ambulatory Visit | Attending: Obstetrics and Gynecology | Admitting: Obstetrics and Gynecology

## 2020-06-17 ENCOUNTER — Ambulatory Visit (INDEPENDENT_AMBULATORY_CARE_PROVIDER_SITE_OTHER): Payer: 59 | Admitting: Obstetrics and Gynecology

## 2020-06-17 VITALS — BP 128/64 | HR 83 | Ht 66.5 in | Wt 128.2 lb

## 2020-06-17 DIAGNOSIS — Z3009 Encounter for other general counseling and advice on contraception: Secondary | ICD-10-CM | POA: Diagnosis not present

## 2020-06-17 DIAGNOSIS — Z124 Encounter for screening for malignant neoplasm of cervix: Secondary | ICD-10-CM | POA: Diagnosis not present

## 2020-06-17 DIAGNOSIS — Z01419 Encounter for gynecological examination (general) (routine) without abnormal findings: Secondary | ICD-10-CM

## 2020-06-17 DIAGNOSIS — N6321 Unspecified lump in the left breast, upper outer quadrant: Secondary | ICD-10-CM

## 2020-06-17 DIAGNOSIS — Z8742 Personal history of other diseases of the female genital tract: Secondary | ICD-10-CM | POA: Diagnosis not present

## 2020-06-17 DIAGNOSIS — Z30431 Encounter for routine checking of intrauterine contraceptive device: Secondary | ICD-10-CM | POA: Diagnosis not present

## 2020-06-17 NOTE — Patient Instructions (Signed)

## 2020-06-17 NOTE — Progress Notes (Signed)
39 y.o. G42P1001 Married White or Caucasian Not Hispanic or Latino female here for annual exam and to establish care. She has a h/o endometriosis. She had surgery in 2013, removal of endometriomas and treated endometriosis.  Got pregnant with IVF, daughter was born in 10/15.  She had an IUD placed as soon as she could postpartum.  Up until a few months ago she wasn't having cycles. Cycling monthly x 3 days, spotting. Cramping, getting more obvious.  Sexually active, occasionally uncomfortable, some dryness.  No bowel or bladder issue.  She notices a lump in her right axilla, first noticed it a few days ago. Doesn't feel it now. Not tender.  Her best friend died of breast cancer at 76.   No LMP recorded. (Menstrual status: IUD).          Sexually active: Yes.    The current method of family planning is IUD.    Exercising: Yes.    yoga, running, walking  Smoker:  no  Health Maintenance: Pap:  03/2019 normal  History of abnormal Pap:  no MMG:  2020 normal  BMD:   None  Colonoscopy: none  TDaP:  03/06/14 Gardasil: none    reports that she has never smoked. She has never used smokeless tobacco. She reports that she does not drink alcohol and does not use drugs. Social ETOH. 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 She has a 24 year old daughter  Past Medical History:  Diagnosis Date  . AMA (advanced maternal age) primigravida 67+   . Endometriosis   . Hypertension   . Newborn product of IVF pregnancy   . Postpartum care following vaginal delivery (10/14) 04/24/2014  . Postpartum hemorrhage, postpartum condition 04/24/2014  BP is stress related, Had elevated BP on OCP's  Past Surgical History:  Procedure Laterality Date  . ABLATION ON ENDOMETRIOSIS    . APPENDECTOMY    . DILATION AND EVACUATION N/A 04/23/2014   Procedure: DILATATION AND EVACUATION;  Surgeon: Lovenia Kim, MD;  Location: [place];  Service: Gynecology;  Laterality: N/A;  . ENDOMETRIAL  ABLATION    . EXAMINATION UNDER ANESTHESIA N/A 04/23/2014   Procedure: EXAM UNDER ANESTHESIA placement of Bakri Balloon;  Surgeon: Lovenia Kim, MD;  Location: [facility];  Service: Gynecology;  Laterality: N/A;  . WISDOM TOOTH EXTRACTION      Current Outpatient Medications  Medication Sig Dispense Refill  . ALPRAZolam (XANAX) 0.25 MG tablet Take 1 tablet (0.25 mg total) by mouth 2 (two) times daily as needed for anxiety. 20 tablet 0  . cetirizine (ZYRTEC) 10 MG tablet Take by mouth.     No current facility-administered medications for this visit.    Family History  Problem Relation Age of Onset  . Cancer Maternal Grandmother 61       breast  . Breast cancer Maternal Grandmother 73  . Heart attack Maternal Grandfather 45  . Diabetes Paternal Grandmother   . Diabetes Paternal Grandfather   . Cancer Paternal Grandfather        prostate  . Hyperlipidemia Father     Review of Systems  All other systems reviewed and are negative.   Exam:   BP 128/64   Pulse 83   Ht 5' 6.5" (1.689 m)   Wt 128 lb 3.2 oz (58.2 kg)   SpO2 100%   BMI 20.38 kg/m   Weight change: @WEIGHTCHANGE @ Height:   Height: 5' 6.5" (168.9 cm)  Ht Readings from Last 3  Encounters:  06/17/20 5' 6.5" (1.689 m)  02/13/20 5\' 7"  (1.702 m)  11/15/19 5\' 7"  (1.702 m)    General appearance: alert, cooperative and appears stated age Head: Normocephalic, without obvious abnormality, atraumatic Neck: no adenopathy, supple, symmetrical, trachea midline and thyroid normal to inspection and palpation Lungs: clear to auscultation bilaterally Cardiovascular: regular rate and rhythm Breasts: in the left breast at 2 o'clock, just outside the areolar region is a 1 cm, smooth, mobile lump. No other lumps, no skin changes.  Abdomen: soft, non-tender; non distended,  no masses,  no organomegaly Extremities: extremities normal, atraumatic, no cyanosis or edema Skin: Skin color, texture, turgor normal. No rashes or lesions Lymph  nodes: Cervical, supraclavicular, and axillary nodes normal. No abnormal inguinal nodes palpated Neurologic: Grossly normal   Pelvic: External genitalia:  no lesions              Urethra:  normal appearing urethra with no masses, tenderness or lesions              Bartholins and Skenes: normal                 Vagina: normal appearing vagina with normal color and discharge, no lesions              Cervix: no lesions and IUD string 4 cm               Bimanual Exam:  Uterus:  normal size, contour, position, consistency, mobility, non-tender and retroverted              Adnexa: no mass, fullness, tenderness               Rectovaginal: Confirms               Anus:  normal sphincter tone, no lesions  Gae Dry chaperoned for the exam.  A:  Well Woman with normal exam  Has a h/o endometriosis  She has a mirena IUD, in since 2015. Her cycles have started to come back and cramps are starting up again. Needs contraception  Left breast lump, 2 o'clock  P:   Pap with hpv  Labs UTD  Will set up diagnostic breast imaging  Discussed breast self exam  Discussed calcium and vit D intake  After her mammogram she will return for Mirena IUD exchange

## 2020-06-18 ENCOUNTER — Telehealth: Payer: Self-pay

## 2020-06-18 NOTE — Telephone Encounter (Signed)
Call placed to convey benefits for Mirena exchange.

## 2020-06-19 ENCOUNTER — Telehealth: Payer: Self-pay

## 2020-06-19 ENCOUNTER — Other Ambulatory Visit: Payer: Self-pay | Admitting: Obstetrics and Gynecology

## 2020-06-19 DIAGNOSIS — N6320 Unspecified lump in the left breast, unspecified quadrant: Secondary | ICD-10-CM

## 2020-06-19 DIAGNOSIS — N6321 Unspecified lump in the left breast, upper outer quadrant: Secondary | ICD-10-CM

## 2020-06-19 LAB — CYTOLOGY - PAP
Comment: NEGATIVE
Diagnosis: REACTIVE
High risk HPV: NEGATIVE

## 2020-06-19 NOTE — Telephone Encounter (Signed)
Spoke with pt. Pt given update and appt at [facility] for Dx MMG and [place]. Pt agreeable to date and time of appt. Last screening MMG was performed by [facility] 03/2019. Pt states was "normal" .  Pt advised to call and see if any earlier cancellations before appt. Pt agreeable. Pt states currently out of town and will check with insurance and return call for possible other places to having imaging. Pt advised ok and will wait for return call.

## 2020-06-19 NOTE — Telephone Encounter (Signed)
Call placed to [facility] and scheduled pt for Dx Bil MMG and Left breast US on 07/30/2020 at 840 am.   Call placed to pt. Left message for pt to return call to triage RN.

## 2020-06-19 NOTE — Telephone Encounter (Signed)
-----   Message from Salvadore Dom, MD sent at 06/17/2020  3:24 PM EST ----- Please set her up for diagnostic breast imaging. Lump at 2 o'clock of the left breast, just outside areolar region.  Thanks, Sharee Pimple

## 2020-06-19 NOTE — Telephone Encounter (Signed)
Patient returned call

## 2020-06-29 NOTE — Telephone Encounter (Signed)
Call placed back to pt. Pt did not answer. Left detailed message for pt to return call with update on dx MMG/US.

## 2020-06-30 NOTE — Telephone Encounter (Signed)
Patient placed in MMG hold.  

## 2020-06-30 NOTE — Telephone Encounter (Signed)
Call returned by pt. Spoke with pt. Pt wanting to know if there is another appt available sooner that 07/30/2020 at [facility]. Pt advised we can try [facility]. Pt agreeable.  Advised pt will call [facility] and return call. Pt agreeable.   Call placed to [facility], spoke with [facility]. Pt scheduled for 07/22/2020 at 830 am.   Call placed back to pt. Spoke with pt. Pt agreeable to have dx MMG/US at [facility]. Pt thankful for earlier date and time. Pt advised still can call for cancellations. Pt agreeable. Pt given number and address to [facility].  Will fax order today.  Routing to Dr Talbert Nan for update Encounter closed  TBC orders cancelled.  Cc: Sharee Pimple ,RN for MMG hold

## 2020-07-01 NOTE — Telephone Encounter (Signed)
Pt declines [facility] due to not in network with insurance. Pt agreeable to keep 07/22/2020 appt at [facility]. Encounter closed

## 2020-07-01 NOTE — Telephone Encounter (Signed)
You can also try [facility], they say they offer same day diagnostic breast imaging and results.

## 2020-07-08 ENCOUNTER — Telehealth: Payer: Self-pay

## 2020-07-08 NOTE — Telephone Encounter (Signed)
Patient says she is returning a call to schedule iud insertion.

## 2020-07-14 NOTE — Telephone Encounter (Signed)
Spoke with patient. Request to scheduled Mirena IUD exchange, discussed at [facility] on 06/17/20.  Patient is scheduled for Dx MMG at [facility] on 07/22/20.  Mirena IUD exchange scheduled for 1/25 at 3pm with Dr. Oscar La.  Advised to take Motrin 800 mg with food and water one hour before procedure. Patient verbalizes understanding and is agreeable.   Routing to provider for final review. Patient is agreeable to disposition. Will close encounter.  Cc: Hayley Carder

## 2020-07-22 ENCOUNTER — Encounter: Payer: Self-pay | Admitting: Obstetrics and Gynecology

## 2020-07-22 DIAGNOSIS — N6489 Other specified disorders of breast: Secondary | ICD-10-CM | POA: Diagnosis not present

## 2020-07-22 DIAGNOSIS — R922 Inconclusive mammogram: Secondary | ICD-10-CM | POA: Diagnosis not present

## 2020-07-23 IMAGING — CR DG HIP (WITH OR WITHOUT PELVIS) 2-3V LEFT
2 series · 2 of 2 positions shown · non-contrast
Comparison: None.

CLINICAL DATA: LEFT hip pain.

EXAM:
DG HIP (WITH OR WITHOUT PELVIS) 2-3V LEFT

[w hip ap left]
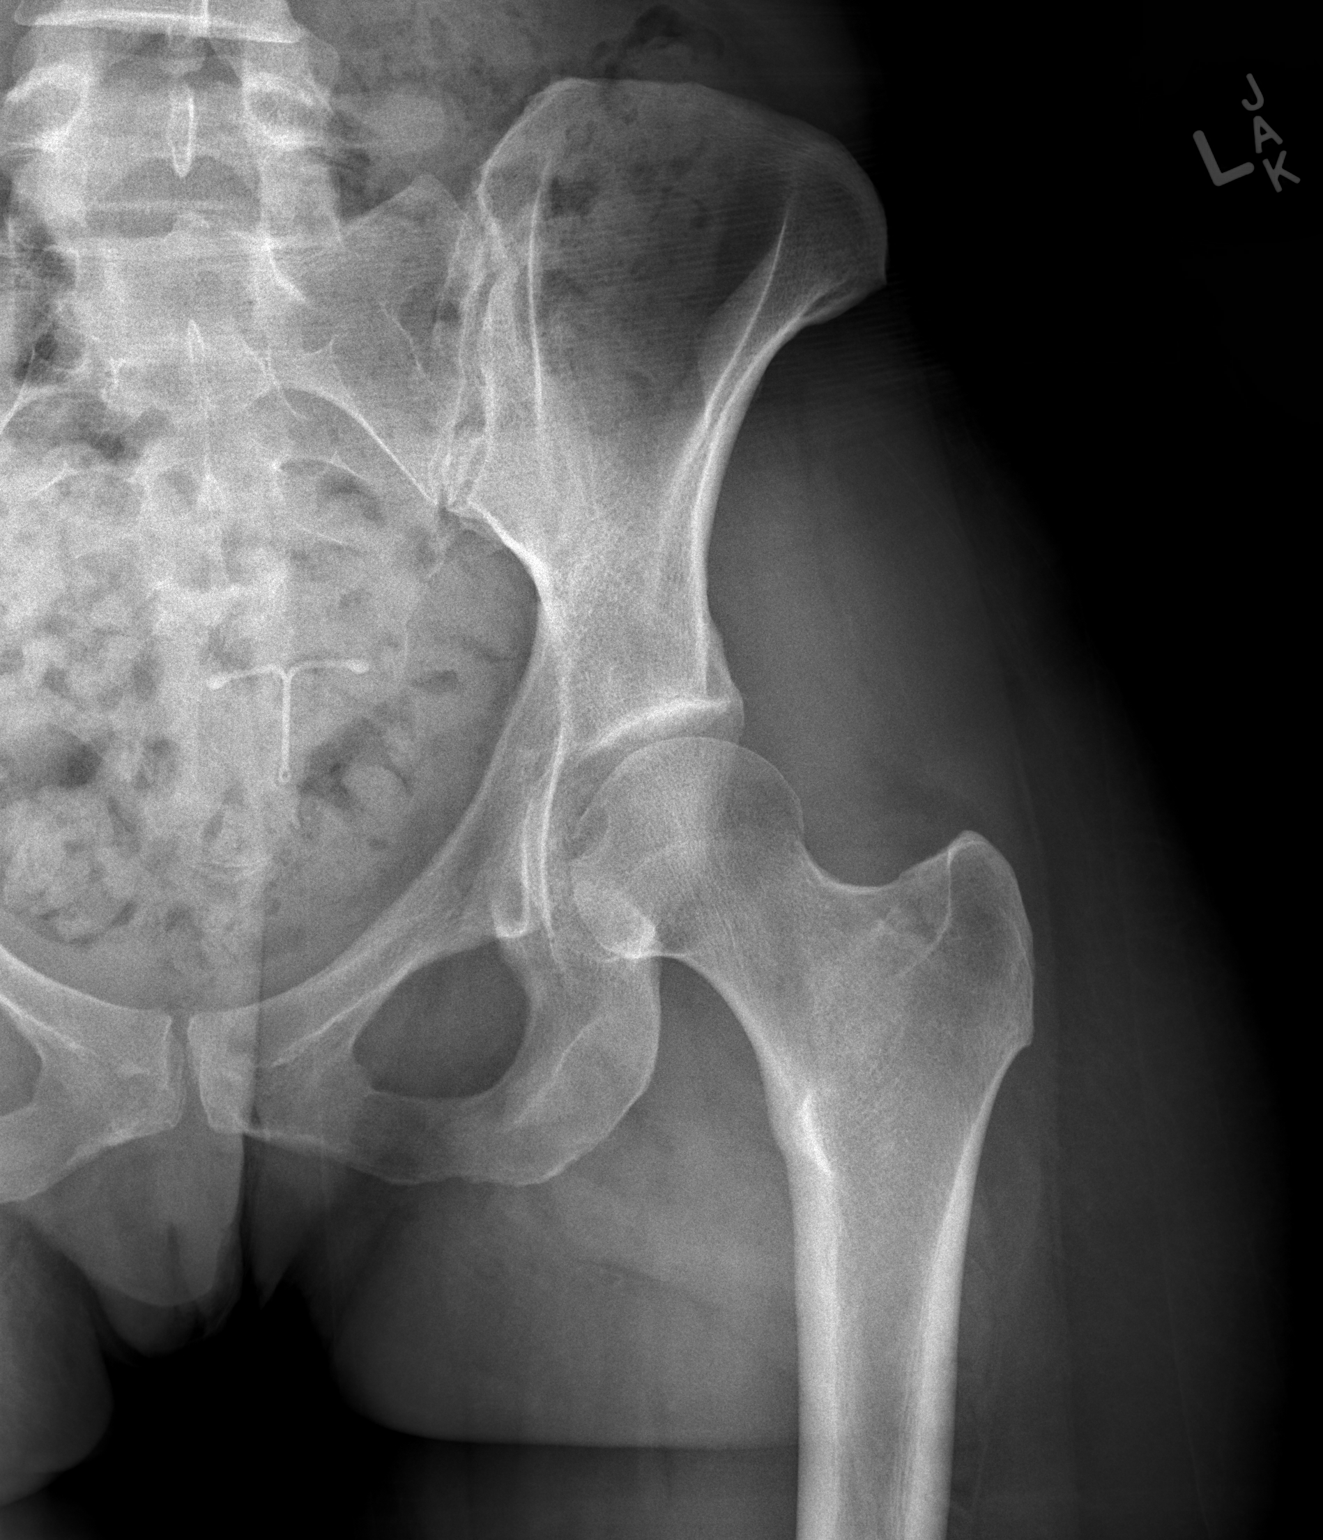

[w hip lat left]
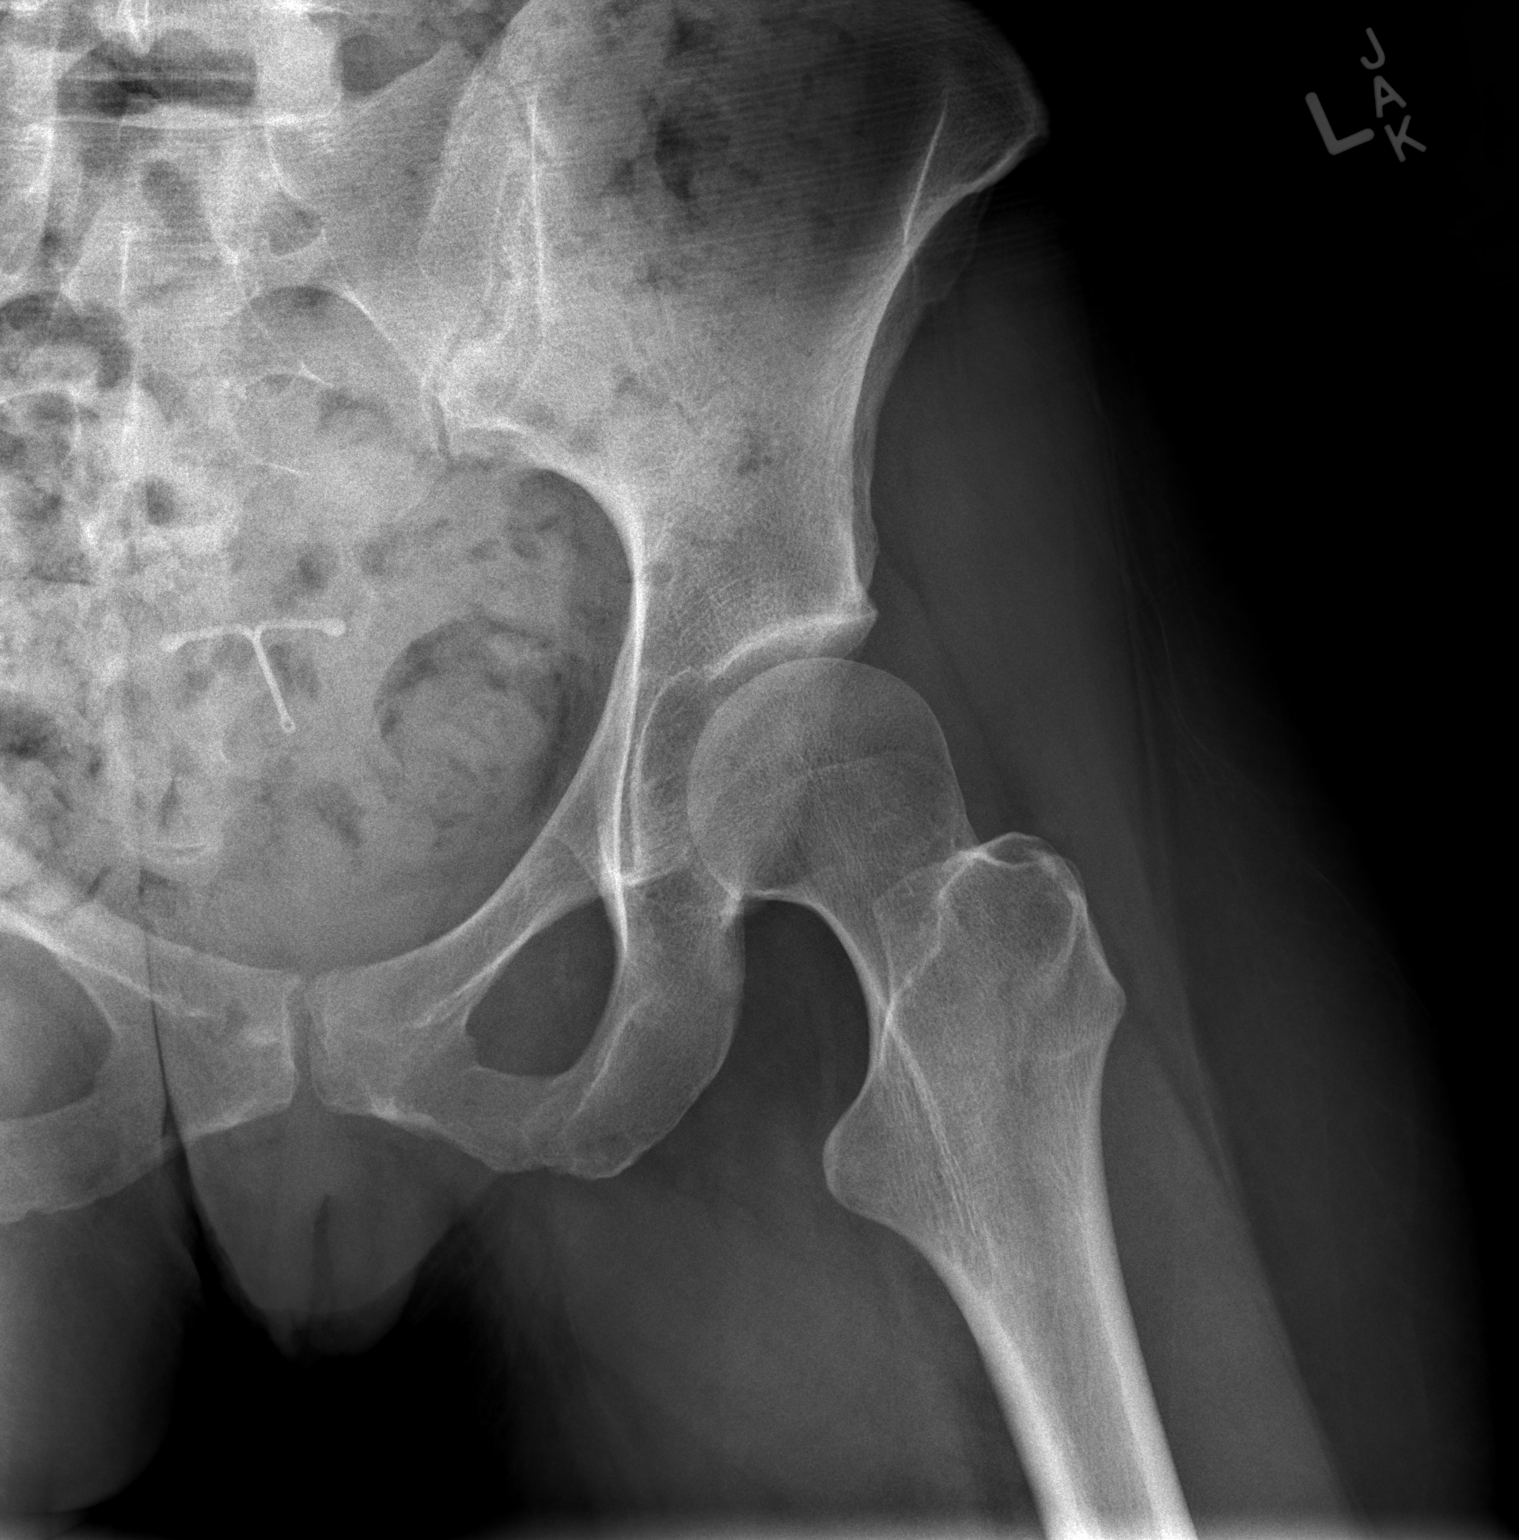

[2 of 2 positions shown; findings below may reference images not displayed]

FINDINGS: There is no evidence of hip fracture or dislocation. There is no
evidence of arthropathy or other focal bone abnormality.IUD projects
over the expected location of the uterus.
IMPRESSION: Negative.

## 2020-07-30 ENCOUNTER — Other Ambulatory Visit: Payer: 59

## 2020-08-04 ENCOUNTER — Ambulatory Visit (INDEPENDENT_AMBULATORY_CARE_PROVIDER_SITE_OTHER): Payer: 59 | Admitting: Obstetrics and Gynecology

## 2020-08-04 ENCOUNTER — Encounter: Payer: Self-pay | Admitting: Obstetrics and Gynecology

## 2020-08-04 ENCOUNTER — Other Ambulatory Visit: Payer: Self-pay

## 2020-08-04 VITALS — BP 122/74 | HR 88 | Ht 66.5 in | Wt 131.0 lb

## 2020-08-04 DIAGNOSIS — Z30432 Encounter for removal of intrauterine contraceptive device: Secondary | ICD-10-CM

## 2020-08-04 DIAGNOSIS — Z30430 Encounter for insertion of intrauterine contraceptive device: Secondary | ICD-10-CM | POA: Diagnosis not present

## 2020-08-04 DIAGNOSIS — Z3009 Encounter for other general counseling and advice on contraception: Secondary | ICD-10-CM | POA: Diagnosis not present

## 2020-08-04 NOTE — Progress Notes (Signed)
GYNECOLOGY  VISIT   HPI: 40 y.o.   Married White or Caucasian Not Hispanic or Latino  female   [place] with Patient's last menstrual period was 08/02/2020.   here for Mirena IUD exchange    She had a mammogram at [facility] since her last visit and was told it was normal.   GYNECOLOGIC HISTORY: Patient's last menstrual period was 08/02/2020. Contraception:Mirena IUD  Menopausal hormone therapy: none        OB History    Gravida  1   Para  1   Term  1   Preterm      AB      Living  1     SAB      IAB      Ectopic      Multiple      Live Births  1              Patient Active Problem List   Diagnosis Date Noted  . Endometriosis 02/13/2020  . IUD (intrauterine device) in place 02/13/2020  . TMJ (temporomandibular joint syndrome) 11/15/2019  . Abnormal auditory perception of left ear 04/26/2018  . Chronic hypertension with superimposed preeclampsia 04/25/2014  . Hereditary disease in family possibly affecting fetus, affecting management of mother, antepartum condition or complication 27/12/2374  . Benign essential hypertension 10/25/2011  . Irritable bowel syndrome 10/25/2011  . Migraine without aura and without status migrainosus, not intractable 10/25/2011    Past Medical History:  Diagnosis Date  . AMA (advanced maternal age) primigravida 75+   . Endometriosis   . Hypertension   . Newborn product of IVF pregnancy   . Postpartum care following vaginal delivery (10/14) 04/24/2014  . Postpartum hemorrhage, postpartum condition 04/24/2014    Past Surgical History:  Procedure Laterality Date  . ABLATION ON ENDOMETRIOSIS    . APPENDECTOMY    . DILATION AND EVACUATION N/A 04/23/2014   Procedure: DILATATION AND EVACUATION;  Surgeon: Lovenia Kim, MD;  Location: [place] [facility];  Service: Gynecology;  Laterality: N/A;  . ENDOMETRIAL ABLATION    . EXAMINATION UNDER ANESTHESIA N/A 04/23/2014   Procedure: EXAM UNDER ANESTHESIA placement of Bakri Balloon;  Surgeon:  Lovenia Kim, MD;  Location: [place] [facility];  Service: Gynecology;  Laterality: N/A;  . WISDOM TOOTH EXTRACTION      Current Outpatient Medications  Medication Sig Dispense Refill  . ALPRAZolam (XANAX) 0.25 MG tablet Take 1 tablet (0.25 mg total) by mouth 2 (two) times daily as needed for anxiety. 20 tablet 0  . cetirizine (ZYRTEC) 10 MG tablet Take by mouth.     No current facility-administered medications for this visit.     ALLERGIES: Penicillins  Family History  Problem Relation Age of Onset  . Cancer Maternal Grandmother 43       breast  . Breast cancer Maternal Grandmother 67  . Heart attack Maternal Grandfather 45  . Diabetes Paternal Grandmother   . Diabetes Paternal Grandfather   . Cancer Paternal Grandfather        prostate  . Hyperlipidemia Father     Social History   Socioeconomic History  . Marital status: Married    Spouse name: Not on file  . Number of children: Not on file  . Years of education: Not on file  . Highest education level: Not on file  Occupational History  . Not on file  Tobacco Use  . Smoking status: Never Smoker  . Smokeless tobacco: Never Used  Vaping Use  . Vaping  Use: Never used  Substance and Sexual Activity  . Alcohol use: No  . Drug use: No  . Sexual activity: Yes    Birth control/protection: I.U.D.  Other Topics Concern  . Not on file  Social History Narrative  . Not on file   Social Determinants of Health   Financial Resource Strain: Not on file  Food Insecurity: Not on file  Transportation Needs: Not on file  Physical Activity: Not on file  Stress: Not on file  Social Connections: Not on file  Intimate Partner Violence: Not on file    Review of Systems  Constitutional: Negative.   HENT: Negative.   Eyes: Negative.   Respiratory: Negative.   Cardiovascular: Negative.   Gastrointestinal: Negative.   Genitourinary: Negative.   Musculoskeletal: Negative.   Skin: Negative.   Neurological: Negative.    Endo/Heme/Allergies: Negative.   Psychiatric/Behavioral: Negative.     PHYSICAL EXAMINATION:    BP 122/74 (BP Location: Right Arm, Patient Position: Sitting, Cuff Size: Normal)   Pulse 88   Ht 5' 6.5" (1.689 m)   Wt 131 lb (59.4 kg)   LMP 08/02/2020   BMI 20.83 kg/m     General appearance: alert, cooperative and appears stated age   Pelvic: External genitalia:  no lesions              Urethra:  normal appearing urethra with no masses, tenderness or lesions              Bartholins and Skenes: normal                 Vagina: normal appearing vagina with normal color and discharge, no lesions              Cervix: no lesions                The risks of mirena IUD insertion were reviewed with the patient, including infection, abnormal bleeding and uterine perfortion. Consent was signed.  A speculum was placed in the vagina, the old IUD was removed with a ringed forceps. The cervix was cleansed with betadine. A tenaculum was placed on the cervix, the uterus sounded to 9 cm. The cervix was dilated to a 5 hagar dilator  The mirena IUD was inserted without difficulty. The string were cut to 3 cm.    The patient tolerated the procedure well.    Chaperone was present for exam.  1. General counseling and advice on female contraception Desires mirena IUD exchange  2. Encounter for IUD removal   3. Encounter for IUD insertion Mirena inserted F/u in one month

## 2020-08-04 NOTE — Patient Instructions (Signed)

## 2020-09-04 ENCOUNTER — Ambulatory Visit: Payer: 59 | Admitting: Obstetrics and Gynecology

## 2020-09-04 NOTE — Progress Notes (Deleted)
GYNECOLOGY  VISIT   HPI: 40 y.o.   Married White or Caucasian Not Hispanic or Latino  female   G1P1001 with No LMP recorded. (Menstrual status: IUD).   here for IUD check.     GYNECOLOGIC HISTORY: No LMP recorded. (Menstrual status: IUD). Contraception:Mirena IUD Menopausal hormone therapy: none        OB History    Gravida  1   Para  1   Term  1   Preterm      AB      Living  1     SAB      IAB      Ectopic      Multiple      Live Births  1              Patient Active Problem List   Diagnosis Date Noted  . Endometriosis 02/13/2020  . IUD (intrauterine device) in place 02/13/2020  . TMJ (temporomandibular joint syndrome) 11/15/2019  . Abnormal auditory perception of left ear 04/26/2018  . Chronic hypertension with superimposed preeclampsia 04/25/2014  . Hereditary disease in family possibly affecting fetus, affecting management of mother, antepartum condition or complication 85/08/7739  . Benign essential hypertension 10/25/2011  . Irritable bowel syndrome 10/25/2011  . Migraine without aura and without status migrainosus, not intractable 10/25/2011    Past Medical History:  Diagnosis Date  . AMA (advanced maternal age) primigravida 53+   . Endometriosis   . Hypertension   . Newborn product of IVF pregnancy   . Postpartum care following vaginal delivery (10/14) 04/24/2014  . Postpartum hemorrhage, postpartum condition 04/24/2014    Past Surgical History:  Procedure Laterality Date  . ABLATION ON ENDOMETRIOSIS    . APPENDECTOMY    . DILATION AND EVACUATION N/A 04/23/2014   Procedure: DILATATION AND EVACUATION;  Surgeon: Lovenia Kim, MD;  Location: [place] [facility];  Service: Gynecology;  Laterality: N/A;  . ENDOMETRIAL ABLATION    . EXAMINATION UNDER ANESTHESIA N/A 04/23/2014   Procedure: EXAM UNDER ANESTHESIA placement of Bakri Balloon;  Surgeon: Lovenia Kim, MD;  Location: [place] [facility];  Service: Gynecology;  Laterality: N/A;  . WISDOM TOOTH  EXTRACTION      Current Outpatient Medications  Medication Sig Dispense Refill  . ALPRAZolam (XANAX) 0.25 MG tablet Take 1 tablet (0.25 mg total) by mouth 2 (two) times daily as needed for anxiety. 20 tablet 0  . cetirizine (ZYRTEC) 10 MG tablet Take by mouth.     No current facility-administered medications for this visit.     ALLERGIES: Penicillins  Family History  Problem Relation Age of Onset  . Cancer Maternal Grandmother 23       breast  . Breast cancer Maternal Grandmother 94  . Heart attack Maternal Grandfather 45  . Diabetes Paternal Grandmother   . Diabetes Paternal Grandfather   . Cancer Paternal Grandfather        prostate  . Hyperlipidemia Father     Social History   Socioeconomic History  . Marital status: Married    Spouse name: Not on file  . Number of children: Not on file  . Years of education: Not on file  . Highest education level: Not on file  Occupational History  . Not on file  Tobacco Use  . Smoking status: Never Smoker  . Smokeless tobacco: Never Used  Vaping Use  . Vaping Use: Never used  Substance and Sexual Activity  . Alcohol use: No  . Drug use: No  .  Sexual activity: Yes    Birth control/protection: I.U.D.  Other Topics Concern  . Not on file  Social History Narrative  . Not on file   Social Determinants of Health   Financial Resource Strain: Not on file  Food Insecurity: Not on file  Transportation Needs: Not on file  Physical Activity: Not on file  Stress: Not on file  Social Connections: Not on file  Intimate Partner Violence: Not on file    ROS  PHYSICAL EXAMINATION:    There were no vitals taken for this visit.    General appearance: alert, cooperative and appears stated age Neck: no adenopathy, supple, symmetrical, trachea midline and thyroid {CHL AMB PHY EX THYROID NORM DEFAULT:[phone]::"normal to inspection and palpation"} Breasts: {Exam; breast:13139::"normal appearance, no masses or tenderness"} Abdomen:  soft, non-tender; non distended, no masses,  no organomegaly  Pelvic: External genitalia:  no lesions              Urethra:  normal appearing urethra with no masses, tenderness or lesions              Bartholins and Skenes: normal                 Vagina: normal appearing vagina with normal color and discharge, no lesions              Cervix: {CHL AMB PHY EX CERVIX NORM [phone]::"no lesions"}              Bimanual Exam:  Uterus:  {CHL AMB PHY EX UTERUS NORM DEFAULT:[phone]::"normal size, contour, position, consistency, mobility, non-tender"}              Adnexa: {CHL AMB PHY EX ADNEXA NO MASS DEFAULT:[phone]::"no mass, fullness, tenderness"}              Rectovaginal: {yes no:[id number]}.  Confirms.              Anus:  normal sphincter tone, no lesions  Chaperone was present for exam.  ASSESSMENT     PLAN    An After Visit Summary was printed and given to the patient.  *** minutes face to face time of which over 50% was spent in counseling.

## 2020-09-08 NOTE — Progress Notes (Signed)
GYNECOLOGY  VISIT  CC:   IUD check  HPI: 40 y.o. G2P1001 Married White or Caucasian female here for iud recheck.   No problems, no bleeding, has not tried to check strings  Also reports a spot under her arm that is itchy x 2 weeks  GYNECOLOGIC HISTORY: No LMP recorded. (Menstrual status: IUD). Contraception: mirena iud inserted 08-04-20 Menopausal hormone therapy: none  Patient Active Problem List   Diagnosis Date Noted  . Endometriosis 02/13/2020  . IUD (intrauterine device) in place 02/13/2020  . TMJ (temporomandibular joint syndrome) 11/15/2019  . Abnormal auditory perception of left ear 04/26/2018  . Chronic hypertension with superimposed preeclampsia 04/25/2014  . Hereditary disease in family possibly affecting fetus, affecting management of mother, antepartum condition or complication 51/76/1607  . Benign essential hypertension 10/25/2011  . Irritable bowel syndrome 10/25/2011  . Migraine without aura and without status migrainosus, not intractable 10/25/2011    Past Medical History:  Diagnosis Date  . AMA (advanced maternal age) primigravida 65+   . Endometriosis   . Hypertension   . Newborn product of IVF pregnancy   . Postpartum care following vaginal delivery (10/14) 04/24/2014  . Postpartum hemorrhage, postpartum condition 04/24/2014    Past Surgical History:  Procedure Laterality Date  . ABLATION ON ENDOMETRIOSIS    . APPENDECTOMY    . DILATION AND EVACUATION N/A 04/23/2014   Procedure: DILATATION AND EVACUATION;  Surgeon: Lovenia Kim, MD;  Location: [place] [facility];  Service: Gynecology;  Laterality: N/A;  . ENDOMETRIAL ABLATION    . EXAMINATION UNDER ANESTHESIA N/A 04/23/2014   Procedure: EXAM UNDER ANESTHESIA placement of Bakri Balloon;  Surgeon: Lovenia Kim, MD;  Location: [place] [facility];  Service: Gynecology;  Laterality: N/A;  . mirena     inserted 08-04-20  . WISDOM TOOTH EXTRACTION      MEDS:   Current Outpatient Medications on File Prior to Visit   Medication Sig Dispense Refill  . ALPRAZolam (XANAX) 0.25 MG tablet Take 1 tablet (0.25 mg total) by mouth 2 (two) times daily as needed for anxiety. 20 tablet 0  . cetirizine (ZYRTEC) 10 MG tablet Take by mouth.    . levonorgestrel (MIRENA) 20 MCG/24HR IUD 1 each by Intrauterine route once.     No current facility-administered medications on file prior to visit.    ALLERGIES: Penicillins  Family History  Problem Relation Age of Onset  . Cancer Maternal Grandmother 72       breast  . Breast cancer Maternal Grandmother 5  . Heart attack Maternal Grandfather 45  . Diabetes Paternal Grandmother   . Diabetes Paternal Grandfather   . Cancer Paternal Grandfather        prostate  . Hyperlipidemia Father      Review of Systems  Constitutional: Negative.   HENT: Negative.   Eyes: Negative.   Respiratory: Negative.   Cardiovascular: Negative.   Gastrointestinal: Negative.   Endocrine: Negative.   Genitourinary: Negative.   Musculoskeletal: Negative.   Skin: Negative.   Allergic/Immunologic: Negative.   Neurological: Negative.   Hematological: Negative.   Psychiatric/Behavioral: Negative.     PHYSICAL EXAMINATION:    BP 104/64   Pulse 68   Resp 16   Wt 130 lb (59 kg)   BMI 20.67 kg/m     General appearance: alert, cooperative, no acute distress  Red circular lesion in L axilla ~1.5 cm x 2.5 cm, states has been present x 2 weeks   Pelvic: External genitalia:  no lesions  Urethra:  normal appearing urethra with no masses, tenderness or lesions              Bartholins and Skenes: normal                 Vagina: normal appearing vagina               Cervix: no cervical motion tenderness, no lesions and strings visible ~ 3 cm              Bimanual Exam:  Uterus:  normal size, contour, position, consistency, mobility, non-tender              Adnexa: no mass, fullness, tenderness               Assessment: IUD check Probable fungal rash in L  axilla  Plan: Reassured regarding IUD  May try clotrimazole topically applied to rash in axilla, avoid shaving to avoid spreading F/u prn or annually

## 2020-09-11 ENCOUNTER — Encounter: Payer: Self-pay | Admitting: Nurse Practitioner

## 2020-09-11 ENCOUNTER — Ambulatory Visit (INDEPENDENT_AMBULATORY_CARE_PROVIDER_SITE_OTHER): Payer: 59 | Admitting: Nurse Practitioner

## 2020-09-11 ENCOUNTER — Other Ambulatory Visit: Payer: Self-pay

## 2020-09-11 VITALS — BP 104/64 | HR 68 | Resp 16 | Wt 130.0 lb

## 2020-09-11 DIAGNOSIS — Z30431 Encounter for routine checking of intrauterine contraceptive device: Secondary | ICD-10-CM | POA: Diagnosis not present

## 2020-10-15 ENCOUNTER — Encounter: Payer: Self-pay | Admitting: Family Medicine

## 2020-10-15 ENCOUNTER — Other Ambulatory Visit (HOSPITAL_COMMUNITY): Payer: Self-pay

## 2020-10-15 ENCOUNTER — Other Ambulatory Visit: Payer: Self-pay

## 2020-10-15 ENCOUNTER — Ambulatory Visit (INDEPENDENT_AMBULATORY_CARE_PROVIDER_SITE_OTHER): Payer: 59 | Admitting: Family Medicine

## 2020-10-15 VITALS — BP 146/88 | HR 56 | Temp 98.2°F | Ht 66.5 in | Wt 131.6 lb

## 2020-10-15 DIAGNOSIS — R21 Rash and other nonspecific skin eruption: Secondary | ICD-10-CM | POA: Diagnosis not present

## 2020-10-15 DIAGNOSIS — F419 Anxiety disorder, unspecified: Secondary | ICD-10-CM | POA: Diagnosis not present

## 2020-10-15 DIAGNOSIS — R1031 Right lower quadrant pain: Secondary | ICD-10-CM

## 2020-10-15 MED ORDER — TRIAMCINOLONE ACETONIDE 0.1 % EX CREA
1.0000 "application " | TOPICAL_CREAM | Freq: Two times a day (BID) | CUTANEOUS | 0 refills | Status: DC
  Filled 2020-10-15: qty 30, 7d supply, fill #0

## 2020-10-15 MED ORDER — ALPRAZOLAM 0.25 MG PO TABS
0.2500 mg | ORAL_TABLET | Freq: Two times a day (BID) | ORAL | 0 refills | Status: DC | PRN
  Filled 2020-10-15 – 2021-02-24 (×2): qty 20, 10d supply, fill #0

## 2020-10-15 NOTE — Progress Notes (Signed)
Patient: Anna Washington MRN: [id number] DOB: Jan 26, 1981 PCP: Orma Flaming, MD     Subjective:  Chief Complaint  Patient presents with  . Rash    Under left arm. She states that it has been there over 2 weeks. She says that it is a perfect circle. It itches, and hurts a little. She has used Hydrocortisone cream, Benadryl lotions, and an antifungal cream. Nothing has helped.     HPI: The patient is a 40 y.o. female who presents today for rash in her left axillary region x 3+ weeks. She has tried benadryl cream, hydrocortisone cream and an anti fungal cream with no relief. It is itching and sometimes hurts her. She asked her gynecologist who gave her an antifungal x 7 days and it made the rash angry. She then tried the hydrocortisone cream and it did nothing. She tried a barrier cream and it didn't help at all. It has not spread, changed. She denies any changes to drugs/soaps, razors/lesions. She has taken no new medication, no recent illness, viral infections, vaccines.   She may be going to [place] in august. Needs a refill of her xanax for flying.   Right lower quadrant heaviness She feels like she can feel something in her RLQ. It has been going on x 1 week. She doesn't think she is constipated. She does have endometriosis. She took the IUD out x 1 month and then had it put back in. She had a new one put back in about 2 weeks ago and has had noticed since that time. She denies any pain. She denies any pain with sex. She doesn't feel it move around. She denies any mass, if she lies on her right side she is more aware of this. Normal IUD insertion.   Review of Systems  Constitutional: Negative for chills, fatigue and fever.  HENT: Negative for dental problem, ear pain, hearing loss and trouble swallowing.   Eyes: Negative for visual disturbance.  Respiratory: Negative for cough, chest tightness and shortness of breath.   Cardiovascular: Negative for chest pain, palpitations and leg swelling.   Gastrointestinal: Negative for abdominal pain, blood in stool, diarrhea, nausea and vomiting.       RLQ fullness   Endocrine: Negative for cold intolerance, polydipsia, polyphagia and polyuria.  Genitourinary: Negative for dysuria and hematuria.  Musculoskeletal: Negative for arthralgias.  Skin: Positive for rash.  Neurological: Negative for dizziness and headaches.  Psychiatric/Behavioral: Negative for dysphoric mood and sleep disturbance. The patient is not nervous/anxious.     Allergies Patient is allergic to penicillins.  Past Medical History Patient  has a past medical history of AMA (advanced maternal age) primigravida 41+, Endometriosis, Hypertension, Newborn product of IVF pregnancy, Postpartum care following vaginal delivery (10/14) (04/24/2014), and Postpartum hemorrhage, postpartum condition (04/24/2014).  Surgical History Patient  has a past surgical history that includes Appendectomy; Wisdom tooth extraction; Ablation on endometriosis; Examination under anesthesia (N/A, 04/23/2014); Dilation and evacuation (N/A, 04/23/2014); Endometrial ablation; and mirena.  Family History Pateint's family history includes Breast cancer (age of onset: 48) in her maternal grandmother; Cancer in her paternal grandfather; Cancer (age of onset: 20) in her maternal grandmother; Diabetes in her paternal grandfather and paternal grandmother; Heart attack (age of onset: 51) in her maternal grandfather; Hyperlipidemia in her father.  Social History Patient  reports that she has never smoked. She has never used smokeless tobacco. She reports current alcohol use. She reports that she does not use drugs.    Objective: Vitals:  10/15/20 1119  BP: (!) 146/88  Pulse: (!) 56  Temp: 98.2 F (36.8 C)  TempSrc: Temporal  SpO2: 100%  Weight: 131 lb 9.6 oz (59.7 kg)  Height: 5' 6.5" (1.689 m)    Body mass index is 20.92 kg/m.  Physical Exam Vitals reviewed.  Constitutional:      Appearance:  Normal appearance. She is normal weight.  HENT:     Head: Normocephalic and atraumatic.  Cardiovascular:     Rate and Rhythm: Normal rate and regular rhythm.     Heart sounds: Normal heart sounds.  Pulmonary:     Effort: Pulmonary effort is normal.     Breath sounds: Normal breath sounds.  Abdominal:     General: Abdomen is flat. Bowel sounds are normal. There is no distension.     Palpations: Abdomen is soft. There is no mass.     Tenderness: There is no abdominal tenderness.  Skin:    Findings: Rash (left axillary she has erythematous annular lesion) present.  Neurological:     General: No focal deficit present.     Mental Status: She is alert and oriented to person, place, and time.  Psychiatric:        Behavior: Behavior normal.        Assessment/plan: 1. Rash ?granuloma annulare. Discussed typically will resolve, will do trial topical steroids and then has f/u with derm. Can let me know if any issues in the meantime.   2. Abdominal pain, RLQ Has had issues since IUD put back in and has hx of endometriosis. No surgical or abnormal finding on exam today. Will check ultrasound to make sure no perforated IUD and then go from there. Precautions given.  - US Pelvic Complete With Transvaginal; Future  -filled xanax for flying. pmp website verified.     Return if symptoms worsen or fail to improve.     Orma Flaming, MD [place]  10/15/2020

## 2020-10-15 NOTE — Patient Instructions (Signed)
-  refilled xanax  -let's check ultrasond for the RLQ pain, If normal we can go from there.   -for your skin: ? If irritation vs. Granuloma annulare. Will start with topical steroids. It typically is self limiting, but you have f/u with derm!

## 2020-10-27 ENCOUNTER — Other Ambulatory Visit (HOSPITAL_COMMUNITY): Payer: Self-pay

## 2020-11-02 ENCOUNTER — Other Ambulatory Visit: Payer: Self-pay

## 2020-11-02 ENCOUNTER — Ambulatory Visit
Admission: RE | Admit: 2020-11-02 | Discharge: 2020-11-02 | Disposition: A | Discharge status: Home / Self Care | Payer: 59 | Source: Ambulatory Visit | Attending: Family Medicine | Admitting: Family Medicine

## 2020-11-02 DIAGNOSIS — N800 Endometriosis of uterus: Secondary | ICD-10-CM | POA: Diagnosis not present

## 2020-11-02 DIAGNOSIS — N83292 Other ovarian cyst, left side: Secondary | ICD-10-CM | POA: Diagnosis not present

## 2020-11-02 DIAGNOSIS — R1031 Right lower quadrant pain: Secondary | ICD-10-CM

## 2020-11-03 ENCOUNTER — Telehealth: Payer: Self-pay

## 2020-11-03 NOTE — Telephone Encounter (Signed)
FYI:  Patient states she completed an ultrasound at GI yesterday.    Patient is calling for results.  I have informed patient that Dr. Rogers Blocker was out of the office today but we would call as soon as she reviewed.

## 2020-11-03 NOTE — Telephone Encounter (Signed)
Result note in and resulted to call patient. She does not have mychart set up.  Orma Flaming, MD [place]

## 2020-11-03 NOTE — Telephone Encounter (Signed)
Patient called back regarding lab results

## 2020-11-04 NOTE — Telephone Encounter (Signed)
Spoke with pt to give lab result message. See lab notes

## 2020-11-20 ENCOUNTER — Encounter: Payer: Self-pay | Admitting: Obstetrics and Gynecology

## 2021-02-24 ENCOUNTER — Other Ambulatory Visit (HOSPITAL_COMMUNITY): Payer: Self-pay

## 2021-03-17 ENCOUNTER — Encounter: Payer: Self-pay | Admitting: Obstetrics and Gynecology

## 2021-03-18 NOTE — Telephone Encounter (Signed)
Spoke with patient. Patient would like to know if there is any other test that can be preformed to find out what is causing the discomfort. Severity 2. Patient aware office visit  may be recommended for further evaluation. Patient states she would like to discuss over phone due to high deductible. I will route this to Provider for recommendations.

## 2021-07-13 NOTE — Progress Notes (Signed)
41 y.o. [id number] Married White or Caucasian Not Hispanic or Latino female here for annual exam.  She has a mirena IUD, placed in 1/22. No cycles, occasional spotting. Occasional mild discomfort. No dyspareunia.  H/o endometriosis, had surgery at [facility] in 2013.   She is having some vaginal dryness. Not using a lubricant.   She is having some GSI with valsalva. Getting a little worse with time.     No LMP recorded. (Menstrual status: IUD).          Sexually active: Yes.    The current method of family planning is IUD.    Exercising: Yes.     Walking yoga cardio  Smoker:  no  Health Maintenance: Pap:  06/17/20 WNL Hr HPV Neg  History of abnormal Pap:  no MMG:  07/22/20 u/s lump under left breast. Bi-rads 1 neg  BMD:   none  Colonoscopy: none  TDaP:  03/06/14  Gardasil:  none    reports that she has never smoked. She has never used smokeless tobacco. She reports current alcohol use. She reports that she does not use drugs. She has ~4 glasses of wine a week. 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 She has a 51 year old daughter, in first grade (Spanish immersion program).  Past Medical History:  Diagnosis Date   AMA (advanced maternal age) primigravida 50+    Endometriosis    Hypertension    Newborn product of IVF pregnancy    Postpartum care following vaginal delivery (10/14) 04/24/2014   Postpartum hemorrhage, postpartum condition 04/24/2014    Past Surgical History:  Procedure Laterality Date   ABLATION ON ENDOMETRIOSIS     APPENDECTOMY     DILATION AND EVACUATION N/A 04/23/2014   Procedure: DILATATION AND EVACUATION;  Surgeon: Lovenia Kim, MD;  Location: [facility];  Service: Gynecology;  Laterality: N/A;   ENDOMETRIAL ABLATION     EXAMINATION UNDER ANESTHESIA N/A 04/23/2014   Procedure: EXAM UNDER ANESTHESIA placement of Bakri Balloon;  Surgeon: Lovenia Kim, MD;  Location: [facility];  Service: Gynecology;  Laterality: N/A;   mirena     inserted  08-04-20   WISDOM TOOTH EXTRACTION      Current Outpatient Medications  Medication Sig Dispense Refill   ALPRAZolam (XANAX) 0.25 MG tablet Take 1 tablet (0.25 mg total) by mouth 2 (two) times daily as needed for anxiety. 20 tablet 0   cetirizine (ZYRTEC) 10 MG tablet Take by mouth.     levonorgestrel (MIRENA) 20 MCG/24HR IUD 1 each by Intrauterine route once.     No current facility-administered medications for this visit.    Family History  Problem Relation Age of Onset   Cancer Maternal Grandmother 79       breast   Breast cancer Maternal Grandmother 63   Heart attack Maternal Grandfather 45   Diabetes Paternal Grandmother    Diabetes Paternal Grandfather    Cancer Paternal Grandfather        prostate   Hyperlipidemia Father     Review of Systems  All other systems reviewed and are negative. She feels bloated a lot of the time. She eats lots of vegetables.   Exam:   BP 128/70    Pulse 62    Ht 5' 7.5" (1.715 m)    Wt 129 lb (58.5 kg)    SpO2 99%    BMI 19.91 kg/m   Weight change: @WEIGHTCHANGE @ Height:   Height: 5' 7.5" (171.5 cm)  Ht Readings from Last 3 Encounters:  07/15/21 5' 7.5" (1.715 m)  10/15/20 5' 6.5" (1.689 m)  08/04/20 5' 6.5" (1.689 m)    General appearance: alert, cooperative and appears stated age Head: Normocephalic, without obvious abnormality, atraumatic Neck: no adenopathy, supple, symmetrical, trachea midline and thyroid normal to inspection and palpation Lungs: clear to auscultation bilaterally Cardiovascular: regular rate and rhythm Breasts: normal appearance, no masses or tenderness Abdomen: soft, non-tender; non distended,  no masses,  no organomegaly Extremities: extremities normal, atraumatic, no cyanosis or edema Skin: Skin color, texture, turgor normal. No rashes or lesions Lymph nodes: Cervical, supraclavicular, and axillary nodes normal. No abnormal inguinal nodes palpated Neurologic: Grossly normal   Pelvic: External genitalia:  no  lesions              Urethra:  normal appearing urethra with no masses, tenderness or lesions              Bartholins and Skenes: normal                 Vagina: normal appearing vagina with normal color and discharge, no lesions              Cervix: no lesions and IUD string 3 cm               Bimanual Exam:  Uterus:  normal size, contour, position, consistency, mobility, non-tender              Adnexa: no mass, fullness, tenderness               Rectovaginal: Confirms               Anus:  normal sphincter tone, no lesions  Gae Dry chaperoned for the exam.  1. Well woman exam No pap this year Mammogram due, she will schedule Discussed breast self exam Discussed calcium and vit D intake  2. IUD check up Doing well  3. GSI (genuine stress incontinence), female Slightly worsening.  Will refer for pelvic floor PT  4. Vegetarian diet - VITAMIN D 25 Hydroxy (Vit-D Deficiency, Fractures) - Vitamin B12  5. Vitamin D deficiency (in her diet) - VITAMIN D 25 Hydroxy (Vit-D Deficiency, Fractures)  6. Laboratory exam ordered as part of routine general medical examination - CBC - Comprehensive metabolic panel - Lipid panel  7. Elevated LDL cholesterol level - Lipid panel  8. Bloating Discussed diet Can try gas-x or beano

## 2021-07-15 ENCOUNTER — Other Ambulatory Visit: Payer: Self-pay

## 2021-07-15 ENCOUNTER — Telehealth: Payer: Self-pay

## 2021-07-15 ENCOUNTER — Encounter: Payer: Self-pay | Admitting: Obstetrics and Gynecology

## 2021-07-15 ENCOUNTER — Ambulatory Visit (INDEPENDENT_AMBULATORY_CARE_PROVIDER_SITE_OTHER): Payer: No Typology Code available for payment source | Admitting: Obstetrics and Gynecology

## 2021-07-15 VITALS — BP 128/70 | HR 62 | Ht 67.5 in | Wt 129.0 lb

## 2021-07-15 DIAGNOSIS — N393 Stress incontinence (female) (male): Secondary | ICD-10-CM

## 2021-07-15 DIAGNOSIS — Z0000 Encounter for general adult medical examination without abnormal findings: Secondary | ICD-10-CM

## 2021-07-15 DIAGNOSIS — Z30431 Encounter for routine checking of intrauterine contraceptive device: Secondary | ICD-10-CM

## 2021-07-15 DIAGNOSIS — R140 Abdominal distension (gaseous): Secondary | ICD-10-CM

## 2021-07-15 DIAGNOSIS — E559 Vitamin D deficiency, unspecified: Secondary | ICD-10-CM

## 2021-07-15 DIAGNOSIS — Z789 Other specified health status: Secondary | ICD-10-CM | POA: Diagnosis not present

## 2021-07-15 DIAGNOSIS — Z01419 Encounter for gynecological examination (general) (routine) without abnormal findings: Secondary | ICD-10-CM

## 2021-07-15 DIAGNOSIS — E7800 Pure hypercholesterolemia, unspecified: Secondary | ICD-10-CM

## 2021-07-15 NOTE — Telephone Encounter (Signed)
Referral order placed in Epic. 

## 2021-07-15 NOTE — Patient Instructions (Addendum)
Try uberlube for vaginal dryness with intercourse.  You can try Replens vaginal moisturizer for routine use.   EXERCISE   We recommended that you start or continue a regular exercise program for good health. Physical activity is anything that gets your body moving, some is better than none. The CDC recommends 150 minutes per week of Moderate-Intensity Aerobic Activity and 2 or more days of Muscle Strengthening Activity.  Benefits of exercise are limitless: helps weight loss/weight maintenance, improves mood and energy, helps with depression and anxiety, improves sleep, tones and strengthens muscles, improves balance, improves bone density, protects from chronic conditions such as heart disease, high blood pressure and diabetes and so much more. To learn more visit: WhyNotPoker.uy  DIET: Good nutrition starts with a healthy diet of fruits, vegetables, whole grains, and lean protein sources. Drink plenty of water for hydration. Minimize empty calories, sodium, sweets. For more information about dietary recommendations visit: GeekRegister.com.ee and http://schaefer-mitchell.com/  ALCOHOL:  Women should limit their alcohol intake to no more than 7 drinks/beers/glasses of wine (combined, not each!) per week. Moderation of alcohol intake to this level decreases your risk of breast cancer and liver damage.  If you are concerned that you may have a problem, or your friends have told you they are concerned about your drinking, there are many resources to help. A well-known program that is free, effective, and available to all people all over the nation is Alcoholics Anonymous.  Check out this site to learn more: BlockTaxes.se   CALCIUM AND VITAMIN D:  Adequate intake of calcium and Vitamin D are recommended for bone health.  You should be getting between 1000-1200 mg of calcium and 800 units of Vitamin D daily between diet  and supplements  PAP SMEARS:  Pap smears, to check for cervical cancer or precancers,  have traditionally been done yearly, scientific advances have shown that most women can have pap smears less often.  However, every woman still should have a physical exam from her gynecologist every year. It will include a breast check, inspection of the vulva and vagina to check for abnormal growths or skin changes, a visual exam of the cervix, and then an exam to evaluate the size and shape of the uterus and ovaries. We will also provide age appropriate advice regarding health maintenance, like when you should have certain vaccines, screening for sexually transmitted diseases, bone density testing, colonoscopy, mammograms, etc.   MAMMOGRAMS:  All women over 23 years old should have a routine mammogram.   COLON CANCER SCREENING: Now recommend starting at age 68. At this time colonoscopy is not covered for routine screening until 50. There are take home tests that can be done between 45-49.   COLONOSCOPY:  Colonoscopy to screen for colon cancer is recommended for all women at age 25.  We know, you hate the idea of the prep.  We agree, BUT, having colon cancer and not knowing it is worse!!  Colon cancer so often starts as a polyp that can be seen and removed at colonscopy, which can quite literally save your life!  And if your first colonoscopy is normal and you have no family history of colon cancer, most women don't have to have it again for 10 years.  Once every ten years, you can do something that may end up saving your life, right?  We will be happy to help you get it scheduled when you are ready.  Be sure to check your insurance coverage so you understand how much  it will cost.  It may be covered as a preventative service at no cost, but you should check your particular policy.      Breast Self-Awareness Breast self-awareness means being familiar with how your breasts look and feel. It involves checking your  breasts regularly and reporting any changes to your health care provider. Practicing breast self-awareness is important. A change in your breasts can be a sign of a serious medical problem. Being familiar with how your breasts look and feel allows you to find any problems early, when treatment is more likely to be successful. All women should practice breast self-awareness, including women who have had breast implants. How to do a breast self-exam One way to learn what is normal for your breasts and whether your breasts are changing is to do a breast self-exam. To do a breast self-exam: Look for Changes  Remove all the clothing above your waist. Stand in front of a mirror in a room with good lighting. Put your hands on your hips. Push your hands firmly downward. Compare your breasts in the mirror. Look for differences between them (asymmetry), such as: Differences in shape. Differences in size. Puckers, dips, and bumps in one breast and not the other. Look at each breast for changes in your skin, such as: Redness. Scaly areas. Look for changes in your nipples, such as: Discharge. Bleeding. Dimpling. Redness. A change in position. Feel for Changes Carefully feel your breasts for lumps and changes. It is best to do this while lying on your back on the floor and again while sitting or standing in the shower or tub with soapy water on your skin. Feel each breast in the following way: Place the arm on the side of the breast you are examining above your head. Feel your breast with the other hand. Start in the nipple area and make  inch (2 cm) overlapping circles to feel your breast. Use the pads of your three middle fingers to do this. Apply light pressure, then medium pressure, then firm pressure. The light pressure will allow you to feel the tissue closest to the skin. The medium pressure will allow you to feel the tissue that is a little deeper. The firm pressure will allow you to feel the  tissue close to the ribs. Continue the overlapping circles, moving downward over the breast until you feel your ribs below your breast. Move one finger-width toward the center of the body. Continue to use the  inch (2 cm) overlapping circles to feel your breast as you move slowly up toward your collarbone. Continue the up and down exam using all three pressures until you reach your armpit.  Write Down What You Find  Write down what is normal for each breast and any changes that you find. Keep a written record with breast changes or normal findings for each breast. By writing this information down, you do not need to depend only on memory for size, tenderness, or location. Write down where you are in your menstrual cycle, if you are still menstruating. If you are having trouble noticing differences in your breasts, do not get discouraged. With time you will become more familiar with the variations in your breasts and more comfortable with the exam. How often should I examine my breasts? Examine your breasts every month. If you are breastfeeding, the best time to examine your breasts is after a feeding or after using a breast pump. If you menstruate, the best time to examine your breasts is  5-7 days after your period is over. During your period, your breasts are lumpier, and it may be more difficult to notice changes. When should I see my health care provider? See your health care provider if you notice: A change in shape or size of your breasts or nipples. A change in the skin of your breast or nipples, such as a reddened or scaly area. Unusual discharge from your nipples. A lump or thick area that was not there before. Pain in your breasts. Anything that concerns you.  Abdominal Bloating When you have abdominal bloating, your abdomen may feel full, tight, or painful. It may also look bigger than normal or swollen (distended). Common causes of abdominal bloating include: Swallowing  air. Constipation. Problems digesting food. Eating too much. Irritable bowel syndrome. This is a condition that affects the large intestine. Lactose intolerance. This is an inability to digest lactose, a natural sugar in dairy products. Celiac disease. This is a condition that affects the ability to digest gluten, a protein found in some grains. Gastroparesis. This is a condition that slows down the movement of food in the stomach and small intestine. It is more common in people with diabetes mellitus. Gastroesophageal reflux disease (GERD). This is a condition that makes stomach acid flow back into the esophagus. Urinary retention. This means that the body is holding onto urine, and the bladder cannot be emptied all the way. Follow these instructions at home: Eating and drinking Avoid eating too much. Try not to swallow air while talking or eating. Avoid eating while lying down. Avoid these foods and drinks: Foods that cause gas, such as broccoli, cabbage, cauliflower, and baked beans. Carbonated drinks. Hard candy. Chewing gum. Medicines Take over-the-counter and prescription medicines only as told by your health care provider. Take probiotic medicines. These medicines contain live bacteria or yeasts that can help digestion. Take coated peppermint oil capsules. General instructions Try to exercise regularly. Exercise may help to relieve bloating that is caused by gas and relieve constipation. Keep all follow-up visits. This is important. Contact a health care provider if: You have nausea and vomiting. You have diarrhea. You have abdominal pain. You have unusual weight loss or weight gain. You have severe pain, and medicines do not help. Get help right away if: You have chest pain. You have trouble breathing. You have shortness of breath. You have trouble urinating. You have darker urine than normal. You have blood in your stools or have dark, tarry stools. These symptoms may  represent a serious problem that is an emergency. Do not wait to see if the symptoms will go away. Get medical help right away. Call your local emergency services (911 in the [place] Do not drive yourself to the hospital. Summary Abdominal bloating means that the abdomen is swollen. Common causes of abdominal bloating are swallowing air, constipation, and problems digesting food. Avoid eating too much and avoid swallowing air. Avoid foods that cause gas, carbonated drinks, hard candy, and chewing gum. This information is not intended to replace advice given to you by your health care provider. Make sure you discuss any questions you have with your health care provider. Document Revised: 01/28/2020 Document Reviewed: 01/28/2020 Elsevier Patient Education  [place]. Urinary Incontinence Urinary incontinence refers to a condition in which a person is unable to control where and when to pass urine. A person with this condition will urinate involuntarily. This means that the person urinates when he or she does not mean to. What are the  causes? This condition may be caused by: Medicines. Infections. Constipation. Overactive bladder muscles. Weak bladder muscles. Weak pelvic floor muscles. These muscles provide support for the bladder, intestine, and, in women, the uterus. Enlarged prostate in men. The prostate is a gland near the bladder. When it gets too big, it can pinch the urethra. With the urethra blocked, the bladder can weaken and lose the ability to empty properly. Surgery. Emotional factors, such as anxiety, stress, or post-traumatic stress disorder (PTSD). Spinal cord injury, nerve injury, or other neurological conditions. Pelvic organ prolapse. This happens in women when organs move out of place and into the vagina. This movement can prevent the bladder and urethra from working properly. What increases the risk? The following factors may make you more likely to develop this  condition: Age. The older you are, the higher the risk. Obesity. Being physically inactive. Pregnancy and childbirth. Menopause. Diseases that affect the nerves or spinal cord. Long-term, or chronic, coughing. This can increase pressure on the bladder and pelvic floor muscles. What are the signs or symptoms? Symptoms may vary depending on the type of urinary incontinence you have. They include: A sudden urge to urinate, and passing urine involuntarily before you can get to a bathroom (urge incontinence). Suddenly passing urine when doing activities that force urine to pass, such as coughing, laughing, exercising, or sneezing (stress incontinence). Needing to urinate often but urinating only a small amount, or constantly dribbling urine (overflow incontinence). Urinating because you cannot get to the bathroom in time due to a physical disability, such as arthritis or injury, or due to a communication or thinking problem, such as Alzheimer's disease (functional incontinence). How is this diagnosed? This condition may be diagnosed based on: Your medical history. A physical exam. Tests, such as: Urine tests. X-rays of your kidney and bladder. Ultrasound. CT scan. Cystoscopy. In this procedure, a health care provider inserts a tube with a light and camera (cystoscope) through the urethra and into the bladder to check for problems. Urodynamic testing. These tests assess how well the bladder, urethra, and sphincter can store and release urine. There are different types of urodynamic tests, and they vary depending on what the test is measuring. To help diagnose your condition, your health care provider may recommend that you keep a log of when you urinate and how much you urinate. How is this treated? Treatment for this condition depends on the type of incontinence that you have and its cause. Treatment may include: Lifestyle changes, such as: Quitting smoking. Maintaining a healthy  weight. Staying active. Try to get 150 minutes of moderate-intensity exercise every week. Ask your health care provider which activities are safe for you. Eating a healthy diet. Avoid high-fat foods, like fried foods. Avoid refined carbohydrates like white bread and white rice. Limit how much alcohol and caffeine you drink. Increase your fiber intake. Healthy sources of fiber include beans, whole grains, and fresh fruits and vegetables. Behavioral changes, such as: Pelvic floor muscle exercises. Bladder training, such as lengthening the amount of time between bathroom breaks, or using the bathroom at regular intervals. Using techniques to suppress bladder urges. This can include distraction techniques or controlled breathing exercises. Medicines, such as: Medicines to relax the bladder muscles and prevent bladder spasms. Medicines to help slow or prevent the growth of a man's prostate. Botox injections. These can help relax the bladder muscles. Treatments, such as: Using pulses of electricity to help change bladder reflexes (electrical nerve stimulation). For women, using a medical device  to prevent urine leaks. This is a small, tampon-like, disposable device that is inserted into the urethra. Injecting collagen or carbon beads (bulking agents) into the urinary sphincter. These can help thicken tissue and close the bladder opening. Surgery. Follow these instructions at home: Lifestyle Limit alcohol and caffeine. These can fill your bladder quickly and irritate it. Keep yourself clean to help prevent odors and skin damage. Ask your health care provider about special skin creams and cleansers that can protect the skin from urine. Consider wearing pads or adult diapers. Make sure to change them regularly, and always change them right after experiencing incontinence. General instructions Take over-the-counter and prescription medicines only as told by your health care provider. Use the  bathroom about every 3-4 hours, even if you do not feel the need to urinate. Try to empty your bladder completely every time. After urinating, wait a minute. Then try to urinate again. Make sure you are in a relaxed position while urinating. If your incontinence is caused by nerve problems, keep a log of the medicines you take and the times you go to the bathroom. Keep all follow-up visits. This is important. Where to find more information [employer] of Diabetes and Digestive and Kidney Diseases: [employer]inesFuneral.dk American Urology Association: www.urologyhealth.org Contact a health care provider if: You have pain that gets worse. Your incontinence gets worse. Get help right away if: You have a fever or chills. You are unable to urinate. You have redness in your groin area or down your legs. Summary Urinary incontinence refers to a condition in which a person is unable to control where and when to pass urine. This condition may be caused by medicines, infection, weak bladder muscles, weak pelvic floor muscles, enlargement of the prostate (in men), or surgery. Factors such as older age, obesity, pregnancy and childbirth, menopause, neurological diseases, and chronic coughing may increase your risk for developing this condition. Types of urinary incontinence include urge incontinence, stress incontinence, overflow incontinence, and functional incontinence. This condition is usually treated first with lifestyle and behavioral changes, such as quitting smoking, eating a healthier diet, and doing regular pelvic floor exercises. Other treatment options include medicines, bulking agents, medical devices, electrical nerve stimulation, or surgery. This information is not intended to replace advice given to you by your health care provider. Make sure you discuss any questions you have with your health care provider. Document Revised: 01/31/2020 Document Reviewed: 01/31/2020 Elsevier Patient  Education  [place]. Kegel Exercises Kegel exercises can help strengthen your pelvic floor muscles. The pelvic floor is a group of muscles that support your rectum, small intestine, and bladder. In females, pelvic floor muscles also help support the uterus. These muscles help you control the flow of urine and stool (feces). Kegel exercises are painless and simple. They do not require any equipment. Your provider may suggest Kegel exercises to: Improve bladder and bowel control. Improve sexual response. Improve weak pelvic floor muscles after surgery to remove the uterus (hysterectomy) or after pregnancy, in females. Improve weak pelvic floor muscles after prostate gland removal or surgery, in males. Kegel exercises involve squeezing your pelvic floor muscles. These are the same muscles you squeeze when you try to stop the flow of urine or keep from passing gas. The exercises can be done while sitting, standing, or lying down, but it is best to vary your position. Ask your health care provider which exercises are safe for you. Do exercises exactly as told by your health care provider and adjust  them as directed. Do not begin these exercises until told by your health care provider. Exercises How to do Kegel exercises: Squeeze your pelvic floor muscles tight. You should feel a tight lift in your rectal area. If you are a female, you should also feel a tightness in your vaginal area. Keep your stomach, buttocks, and legs relaxed. Hold the muscles tight for up to 10 seconds. Breathe normally. Relax your muscles for up to 10 seconds. Repeat as told by your health care provider. Repeat this exercise daily as told by your health care provider. Continue to do this exercise for at least 4-6 weeks, or for as long as told by your health care provider. You may be referred to a physical therapist who can help you learn more about how to do Kegel exercises. Depending on your condition, your health care  provider may recommend: Varying how long you squeeze your muscles. Doing several sets of exercises every day. Doing exercises for several weeks. Making Kegel exercises a part of your regular exercise routine. This information is not intended to replace advice given to you by your health care provider. Make sure you discuss any questions you have with your health care provider. Document Revised: 11/05/2020 Document Reviewed: 11/05/2020 Elsevier Patient Education  2022 [employer].

## 2021-07-15 NOTE — Telephone Encounter (Signed)
Salvadore Dom, MD  P [facility] Triage  Please place referral to pelvic floor PT for [facility].

## 2021-07-16 LAB — LIPID PANEL
Cholesterol: 200 mg/dL — ABNORMAL HIGH (ref <200)
HDL: 69 mg/dL (ref >=50)
LDL Cholesterol (Calc): 116 mg/dL (calc) — ABNORMAL HIGH
Non-HDL Cholesterol (Calc): 131 mg/dL (calc) — ABNORMAL HIGH (ref <130)
Total CHOL/HDL Ratio: 2.9 (calc) (ref <5.0)
Triglycerides: 48 mg/dL (ref <150)

## 2021-07-16 LAB — CBC
HCT: 41.2 % (ref 35.0–45.0)
Hemoglobin: 13.8 g/dL (ref 11.7–15.5)
MCH: 30.6 pg (ref 27.0–33.0)
MCHC: 33.5 g/dL (ref 32.0–36.0)
MCV: 91.4 fL (ref 80.0–100.0)
MPV: 9.4 fL (ref 7.5–12.5)
Platelets: 289 10*3/uL (ref 140–400)
RBC: 4.51 10*6/uL (ref 3.80–5.10)
RDW: 11.6 % (ref 11.0–15.0)
WBC: 7.8 10*3/uL (ref 3.8–10.8)

## 2021-07-16 LAB — COMPREHENSIVE METABOLIC PANEL
AG Ratio: 2 (calc) (ref 1.0–2.5)
ALT: 13 U/L (ref 6–29)
AST: 17 U/L (ref 10–30)
Albumin: 4.8 g/dL (ref 3.6–5.1)
Alkaline phosphatase (APISO): 45 U/L (ref 31–125)
BUN: 10 mg/dL (ref 7–25)
CO2: 29 mmol/L (ref 20–32)
Calcium: 9.6 mg/dL (ref 8.6–10.2)
Chloride: 100 mmol/L (ref 98–110)
Creat: 0.55 mg/dL (ref 0.50–0.99)
Globulin: 2.4 g/dL (calc) (ref 1.9–3.7)
Glucose, Bld: 86 mg/dL (ref 65–99)
Potassium: 4.3 mmol/L (ref 3.5–5.3)
Sodium: 136 mmol/L (ref 135–146)
Total Bilirubin: 0.7 mg/dL (ref 0.2–1.2)
Total Protein: 7.2 g/dL (ref 6.1–8.1)

## 2021-07-16 LAB — VITAMIN B12: Vitamin B-12: 365 pg/mL (ref 200–1100)

## 2021-07-16 LAB — VITAMIN D 25 HYDROXY (VIT D DEFICIENCY, FRACTURES): Vit D, 25-Hydroxy: 20 ng/mL — ABNORMAL LOW (ref 30–100)

## 2021-07-22 NOTE — Telephone Encounter (Signed)
PT office left message in patient's voice mail 07/20/21.

## 2021-07-27 ENCOUNTER — Ambulatory Visit (INDEPENDENT_AMBULATORY_CARE_PROVIDER_SITE_OTHER): Payer: No Typology Code available for payment source | Admitting: Physician Assistant

## 2021-07-27 ENCOUNTER — Other Ambulatory Visit: Payer: Self-pay

## 2021-07-27 ENCOUNTER — Other Ambulatory Visit (HOSPITAL_COMMUNITY): Payer: Self-pay

## 2021-07-27 VITALS — BP 133/90 | HR 80 | Temp 97.9°F | Ht 67.5 in | Wt 131.2 lb

## 2021-07-27 DIAGNOSIS — H01136 Eczematous dermatitis of left eye, unspecified eyelid: Secondary | ICD-10-CM | POA: Diagnosis not present

## 2021-07-27 MED ORDER — METRONIDAZOLE 0.75 % EX GEL
1.0000 "application " | Freq: Two times a day (BID) | CUTANEOUS | 0 refills | Status: DC
  Filled 2021-07-27: qty 45, 23d supply, fill #0

## 2021-07-27 MED ORDER — ERYTHROMYCIN 5 MG/GM OP OINT
TOPICAL_OINTMENT | OPHTHALMIC | 0 refills | Status: DC
  Filled 2021-07-27: qty 3.5, 7d supply, fill #0

## 2021-07-27 NOTE — Progress Notes (Signed)
Subjective:    Patient ID: Anna Washington, female    DOB: September 01, 1980, 41 y.o.   MRN: [id number]  Chief Complaint  Patient presents with   Rash    Left eye x 1 month    HPI Patient is in today for left upper eyelid rash x 1-2 months.  Itchy, puffy & irritated feeling. Watering of eyes sometimes. Hx of mild skin irritations. Denies any new make-up or cream. No hx of rosacea she is aware of.  Tx's tried: Hydrocortisone cream (made this area burn), Benadryl cream, baby soap, stopped all eye makeup and creams   Past Medical History:  Diagnosis Date   AMA (advanced maternal age) primigravida 43+    Endometriosis    Hypertension    Newborn product of IVF pregnancy    Postpartum care following vaginal delivery (10/14) 04/24/2014   Postpartum hemorrhage, postpartum condition 04/24/2014    Past Surgical History:  Procedure Laterality Date   ABLATION ON ENDOMETRIOSIS     APPENDECTOMY     DILATION AND EVACUATION N/A 04/23/2014   Procedure: DILATATION AND EVACUATION;  Surgeon: Lovenia Kim, MD;  Location: [facility];  Service: Gynecology;  Laterality: N/A;   ENDOMETRIAL ABLATION     EXAMINATION UNDER ANESTHESIA N/A 04/23/2014   Procedure: EXAM UNDER ANESTHESIA placement of Bakri Balloon;  Surgeon: Lovenia Kim, MD;  Location: [facility];  Service: Gynecology;  Laterality: N/A;   mirena     inserted 08-04-20   WISDOM TOOTH EXTRACTION      Family History  Problem Relation Age of Onset   Cancer Maternal Grandmother 55       breast   Breast cancer Maternal Grandmother 25   Heart attack Maternal Grandfather 45   Diabetes Paternal Grandmother    Diabetes Paternal Grandfather    Cancer Paternal Grandfather        prostate   Hyperlipidemia Father     Social History   Tobacco Use   Smoking status: Never   Smokeless tobacco: Never  Vaping Use   Vaping Use: Never used  Substance Use Topics   Alcohol use: Yes    Comment: social   Drug use: No     Allergies  Allergen  Reactions   Penicillins Nausea Only    Review of Systems NEGATIVE UNLESS OTHERWISE INDICATED IN HPI      Objective:     BP 133/90    Pulse 80    Temp 97.9 F (36.6 C)    Ht 5' 7.5" (1.715 m)    Wt 131 lb 4 oz (59.5 kg)    SpO2 100%    BMI 20.25 kg/m   Wt Readings from Last 3 Encounters:  07/27/21 131 lb 4 oz (59.5 kg)  07/15/21 129 lb (58.5 kg)  10/15/20 131 lb 9.6 oz (59.7 kg)    BP Readings from Last 3 Encounters:  07/27/21 133/90  07/15/21 128/70  10/15/20 (!) 146/88     Physical Exam Constitutional:      Appearance: Normal appearance.  Eyes:     General:        Right eye: No discharge or hordeolum.        Left eye: No discharge or hordeolum.     Extraocular Movements: Extraocular movements intact.     Conjunctiva/sclera: Conjunctivae normal.     Pupils: Pupils are equal, round, and reactive to light.     Comments: Left upper eyelid mild edema and scaling on minimal erythematous base  Neurological:  Mental Status: She is alert.       Assessment & Plan:   Problem List Items Addressed This Visit   None    Meds ordered this encounter  Medications   erythromycin ophthalmic ointment    Sig: Apply One-half inch (1.25 cm) four times daily for 5 to 7 days to affected eye(s)    Dispense:  3.5 g    Refill:  0   metroNIDAZOLE (METROGEL) 0.75 % gel    Sig: Apply 1 application topically 2 (two) times daily.    Dispense:  45 g    Refill:  0   1. Eyelid dermatitis, eczematous, left -?possible mild eczema vs ocular rosacea vs blepharitis vs other in differential -Use erythromycin and metrogel as directed -Lid scrubs advised -Do not use any make-up until resolved. -Do not use benadryl cream -F/up with Derm as scheduled next month   Spyridon Hornstein M Yoselin Amerman, PA-C

## 2021-08-12 ENCOUNTER — Other Ambulatory Visit (HOSPITAL_COMMUNITY): Payer: Self-pay

## 2021-08-12 MED ORDER — DESONIDE 0.05 % EX OINT
1.0000 "application " | TOPICAL_OINTMENT | Freq: Two times a day (BID) | CUTANEOUS | 0 refills | Status: DC
  Filled 2021-08-12: qty 15, 14d supply, fill #0
  Filled 2021-08-18: qty 15, 8d supply, fill #0

## 2021-08-13 ENCOUNTER — Other Ambulatory Visit (HOSPITAL_COMMUNITY): Payer: Self-pay

## 2021-08-17 ENCOUNTER — Other Ambulatory Visit (HOSPITAL_COMMUNITY): Payer: Self-pay

## 2021-08-18 ENCOUNTER — Other Ambulatory Visit (HOSPITAL_COMMUNITY): Payer: Self-pay

## 2021-08-19 ENCOUNTER — Other Ambulatory Visit (HOSPITAL_COMMUNITY): Payer: Self-pay

## 2021-08-19 ENCOUNTER — Other Ambulatory Visit: Payer: Self-pay | Admitting: Obstetrics and Gynecology

## 2021-08-19 DIAGNOSIS — Z1231 Encounter for screening mammogram for malignant neoplasm of breast: Secondary | ICD-10-CM

## 2021-08-23 ENCOUNTER — Other Ambulatory Visit (HOSPITAL_COMMUNITY): Payer: Self-pay

## 2021-08-24 ENCOUNTER — Other Ambulatory Visit (HOSPITAL_COMMUNITY): Payer: Self-pay

## 2021-08-26 ENCOUNTER — Ambulatory Visit
Admission: RE | Admit: 2021-08-26 | Discharge: 2021-08-26 | Disposition: A | Discharge status: Home / Self Care | Payer: No Typology Code available for payment source | Source: Ambulatory Visit | Attending: Obstetrics and Gynecology | Admitting: Obstetrics and Gynecology

## 2021-08-26 ENCOUNTER — Other Ambulatory Visit (HOSPITAL_COMMUNITY): Payer: Self-pay

## 2021-08-26 ENCOUNTER — Other Ambulatory Visit: Payer: Self-pay

## 2021-08-26 DIAGNOSIS — Z1231 Encounter for screening mammogram for malignant neoplasm of breast: Secondary | ICD-10-CM

## 2021-08-30 ENCOUNTER — Other Ambulatory Visit (HOSPITAL_COMMUNITY): Payer: Self-pay

## 2021-09-01 ENCOUNTER — Other Ambulatory Visit (HOSPITAL_COMMUNITY): Payer: Self-pay

## 2021-09-07 NOTE — Telephone Encounter (Signed)
Appt scheduled with PT 09/09/21.

## 2021-09-09 ENCOUNTER — Ambulatory Visit
Payer: No Typology Code available for payment source | Attending: Obstetrics and Gynecology | Admitting: Physical Therapy

## 2021-09-09 ENCOUNTER — Other Ambulatory Visit: Payer: Self-pay

## 2021-09-09 ENCOUNTER — Encounter: Payer: Self-pay | Admitting: Physical Therapy

## 2021-09-09 DIAGNOSIS — R279 Unspecified lack of coordination: Secondary | ICD-10-CM | POA: Insufficient documentation

## 2021-09-09 DIAGNOSIS — N393 Stress incontinence (female) (male): Secondary | ICD-10-CM | POA: Diagnosis not present

## 2021-09-09 DIAGNOSIS — M6281 Muscle weakness (generalized): Secondary | ICD-10-CM | POA: Insufficient documentation

## 2021-09-09 NOTE — Therapy (Signed)
?OUTPATIENT PHYSICAL THERAPY FEMALE PELVIC EVALUATION ? ? ?Patient Name: Anna Washington ?MRN: [id number] ?DOB:1981-03-13, 41 y.o., female ?Today's Date: 09/09/2021 ? ? PT End of Session - 09/09/21 1158   ? ? Visit Number 1   ? Date for PT Re-Evaluation 12/02/21   ? Authorization Type [place]   ? PT Start Time 1149   ? PT Stop Time 1227   ? PT Time Calculation (min) 38 min   ? ?  ?  ? ?  ? ? ?Past Medical History:  ?Diagnosis Date  ? AMA (advanced maternal age) primigravida 27+   ? Endometriosis   ? Hypertension   ? Newborn product of IVF pregnancy   ? Postpartum care following vaginal delivery (10/14) 04/24/2014  ? Postpartum hemorrhage, postpartum condition 04/24/2014  ? ?Past Surgical History:  ?Procedure Laterality Date  ? ABLATION ON ENDOMETRIOSIS    ? APPENDECTOMY    ? DILATION AND EVACUATION N/A 04/23/2014  ? Procedure: DILATATION AND EVACUATION;  Surgeon: Lovenia Kim, MD;  Location: [facility];  Service: Gynecology;  Laterality: N/A;  ? ENDOMETRIAL ABLATION    ? EXAMINATION UNDER ANESTHESIA N/A 04/23/2014  ? Procedure: EXAM UNDER ANESTHESIA placement of Bakri Balloon;  Surgeon: Lovenia Kim, MD;  Location: [facility];  Service: Gynecology;  Laterality: N/A;  ? mirena    ? inserted 08-04-20  ? WISDOM TOOTH EXTRACTION    ? ?Patient Active Problem List  ? Diagnosis Date Noted  ? Endometriosis 02/13/2020  ? IUD (intrauterine device) in place 02/13/2020  ? TMJ (temporomandibular joint syndrome) 11/15/2019  ? Abnormal auditory perception of left ear 04/26/2018  ? Chronic hypertension with superimposed preeclampsia 04/25/2014  ? Hereditary disease in family possibly affecting fetus, affecting management of mother, antepartum condition or complication 69/67/8938  ? Benign essential hypertension 10/25/2011  ? Irritable bowel syndrome 10/25/2011  ? Migraine without aura and without status migrainosus, not intractable 10/25/2011  ? ? ?PCP: Mckinley Jewel, MD ? ?REFERRING PROVIDER: Salvadore Dom, MD ? ?REFERRING  DIAG: N39.3 (ICD-10-CM) - GSI (genuine stress incontinence), female  ? ?THERAPY DIAG:  ?Unspecified lack of coordination ? ?Muscle weakness (generalized) ? ?ONSET DATE: 7 year ? ?SUBJECTIVE:                                                                                                                                                                                          ? ?SUBJECTIVE STATEMENT: ?It has been getting worse, just any effort ?Fluid intake: A lot of water 3 x 24 oz +coffee ? ?Patient confirms identification and approves PT to assess pelvic floor and treatment Yes ? ?PERTINENT HISTORY:  ?  endometrial ablation ?Sexual abuse: No ? ?PAIN:  ?Are you having pain? No ? ? ?BOWEL MOVEMENT ?Normal ?URINATION ?Pain with urination: No ?Fully empty bladder: No ?Stream: Strong ?Urgency: No ?Frequency: normal ?Leakage: stress of any kind and will occur a few times/week ?Pads: Yes: only when doing an activity that I know will cause ? ?INTERCOURSE ?Pain with intercourse: No ? ?Climax: Yes: sometimes due to sensation ?Marinoff Scale 0//3 ?  ?PREGNANCY ?Vaginal deliveries 1 ?Tearing Yes: severe post partum hemorrhaging ? ? ?PROLAPSE ?I feel a heaviness sometimes, none observed ? ?PRECAUTIONS: None ? ?WEIGHT BEARING RESTRICTIONS No ? ?FALLS:  ?Has patient fallen in last 6 months? No,  ? ?LIVING ENVIRONMENT: ?Lives with: lives with their family, lives with their spouse, and lives with their daughter ?Lives in: House/apartment ? ? ?OCCUPATION: Electrical engineer - NICU ? ?PLOF: Independent ? ?PATIENT GOALS  Jumping and aerobics class, be able to sneeze ? ? ?OBJECTIVE:  ? ?DIAGNOSTIC FINDINGS:  ?N/A ? ? ?COGNITION: ? Overall cognitive status: Within functional limits for tasks assessed   ?  ? ?MUSCLE LENGTH: ?Hamstrings: Right 90 deg; Left 90 deg ? ? ?POSTURE:  ?Increased lumbar lordosis ? ?PALPATION: ?Internal Pelvic Floor Rt side levators and OI tight ? ?External Perineal Exam minimal to no bulging ? ?GENERAL lumbar  paraspinals and Rt side LE tighter than Left ? ?LUMBARAROM/PROM ? ?A/PROM A/PROM  ?09/09/2021  ?Flexion 80%  ?Extension   ?Right lateral flexion   ?Left lateral flexion   ?Right rotation   ?Left rotation   ? (Blank rows = not tested) ? ?LE AROM/PROM: ? Hip flexion 90%; ER 75% bil ? ? ? ?LE MMT: ?Grossly 5/5 ? ?PELVIC MMT: ?  ?MMT  ?09/09/2021  ?Vaginal 2/5 x 1 rep, not relaxing between, 1 sec hold - fatigues quickly  ?Internal Anal Sphincter   ?External Anal Sphincter   ?Puborectalis   ?Diastasis Recti   ?(Blank rows = not tested) ? ? ?TONE: ?High Rt>Lt ? ?PROLAPSE: ?No ? ? ? ?FUNCTIONAL TESTS:  ?SLS normal ? ?GAIT: ? ?Comments: WFL ? ? ? ?TODAY'S TREATMENT  ?EVAL STM to Rt levators and OI ? ? ?PATIENT EDUCATION:  ?Education details: stretch Rt levators, knack ?Person educated: Patient ?Education method: Explanation, Demonstration, and Verbal cues ?Education comprehension: verbalized understanding and returned demonstration ? ? ?HOME EXERCISE PROGRAM: ?Stretch and knack ? ?ASSESSMENT: ? ?CLINICAL IMPRESSION: ?Patient is a 41 y.o. female who was seen today for physical therapy evaluation and treatment for stress incontinence. Pt is having decreased sensation during intercourse and weak pelvic floor contraction with high tone Rt>Lt. Pt has low endurance of pelvic floor muscles. Pt will benefit from skilled PT to address all above mentioned impairments ? ? ?OBJECTIVE IMPAIRMENTS decreased coordination, decreased strength, increased muscle spasms, and impaired tone.  ? ?ACTIVITY LIMITATIONS community activity and exercise .  ? ?PERSONAL FACTORS Time since onset of injury/illness/exacerbation and 1 comorbidity: tearing complicated vaginal delivery  are also affecting patient's functional outcome.  ? ? ?REHAB POTENTIAL: Excellent ? ?CLINICAL DECISION MAKING: Stable/uncomplicated ? ?EVALUATION COMPLEXITY: Low ? ? ?GOALS: ?Goals reviewed with patient? Yes ? ?SHORT TERM GOALS: ? ?Pt will be independent with initial  HEP ?Baseline: unknown ?Target date: 10/08/2021 ?Goal status: INITIAL ? ? ? ?LONG TERM GOALS: ? ?Pt will be independent with advanced HEP to maintain improvements made throughout therapy ? ?Baseline: unknown ?Target date: 12/02/21 ?Goal status: INITIAL ? ?2.  Pt will be able to functional actions such as jumping without leakage  ?Baseline: cannot do ?  Target date: 12/02/21 ?Goal status: INITIAL ? ?3.  Pt will be able to run/workout for 45 minutes without leakage or discomfort  ?Baseline: cannot do ?Target date: 12/02/21 ?Goal status: INITIAL ? ?4.  Pt will be able to lift at least 10 lb correctly for 10 reps without pain or leakage for functional activities  ? ?Target date: 12/02/21 ?Goal status: INITIAL ? ?PLAN: ?PT FREQUENCY: 1x/week ? ?PT DURATION: 12 weeks ? ?PLANNED INTERVENTIONS: Therapeutic exercises, Therapeutic activity, Neuromuscular re-education, Balance training, Gait training, Patient/Family education, Joint mobilization, Dry Needling, Electrical stimulation, Cryotherapy, Moist heat, Taping, and Manual therapy ? ? ?PLAN FOR NEXT SESSION: internal STM to Rt side again, educate on wand possibly , f/u on HEP, stretch, hip rotation rocking and shifting ; SI releases ? ? ?Jule Ser, PT ?09/09/2021, 2:39 PM ? ?

## 2021-09-13 ENCOUNTER — Other Ambulatory Visit (HOSPITAL_COMMUNITY): Payer: Self-pay

## 2021-09-13 MED ORDER — ALPRAZOLAM 0.25 MG PO TABS
0.2500 mg | ORAL_TABLET | ORAL | 0 refills | Status: DC | PRN
  Filled 2021-09-13: qty 10, 10d supply, fill #0

## 2021-09-30 ENCOUNTER — Encounter: Payer: No Typology Code available for payment source | Admitting: Physical Therapy

## 2021-10-15 ENCOUNTER — Encounter: Payer: No Typology Code available for payment source | Admitting: Physical Therapy

## 2021-10-25 NOTE — Therapy (Signed)
?OUTPATIENT PHYSICAL THERAPY TREATMENT NOTE ? ? ?Patient Name: Anna Washington ?MRN: [id number] ?DOB:1981/06/02, 41 y.o., female ?Today's Date: 10/26/2021 ? ?PCP: Mckinley Jewel, MD ?REFERRING PROVIDER: Mckinley Jewel, MD ? ?END OF SESSION:  ? PT End of Session - 10/26/21 0855   ? ? Visit Number 2   ? Date for PT Re-Evaluation 12/02/21   ? Authorization Type [place]   ? PT Start Time [phone]   late  ? PT Stop Time 8341   ? PT Time Calculation (min) 38 min   ? Activity Tolerance Patient tolerated treatment well   ? Behavior During Therapy [facility] for tasks assessed/performed   ? ?  ?  ? ?  ? ? ?Past Medical History:  ?Diagnosis Date  ? AMA (advanced maternal age) primigravida 54+   ? Endometriosis   ? Hypertension   ? Newborn product of IVF pregnancy   ? Postpartum care following vaginal delivery (10/14) 04/24/2014  ? Postpartum hemorrhage, postpartum condition 04/24/2014  ? ?Past Surgical History:  ?Procedure Laterality Date  ? ABLATION ON ENDOMETRIOSIS    ? APPENDECTOMY    ? DILATION AND EVACUATION N/A 04/23/2014  ? Procedure: DILATATION AND EVACUATION;  Surgeon: Lovenia Kim, MD;  Location: [facility];  Service: Gynecology;  Laterality: N/A;  ? ENDOMETRIAL ABLATION    ? EXAMINATION UNDER ANESTHESIA N/A 04/23/2014  ? Procedure: EXAM UNDER ANESTHESIA placement of Bakri Balloon;  Surgeon: Lovenia Kim, MD;  Location: [facility];  Service: Gynecology;  Laterality: N/A;  ? mirena    ? inserted 08-04-20  ? WISDOM TOOTH EXTRACTION    ? ?Patient Active Problem List  ? Diagnosis Date Noted  ? Endometriosis 02/13/2020  ? IUD (intrauterine device) in place 02/13/2020  ? TMJ (temporomandibular joint syndrome) 11/15/2019  ? Abnormal auditory perception of left ear 04/26/2018  ? Chronic hypertension with superimposed preeclampsia 04/25/2014  ? Hereditary disease in family possibly affecting fetus, affecting management of mother, antepartum condition or complication 96/22/2979  ? Benign essential hypertension 10/25/2011  ? Irritable  bowel syndrome 10/25/2011  ? Migraine without aura and without status migrainosus, not intractable 10/25/2011  ? ? ?REFERRING DIAG: N39.3 (ICD-10-CM) - GSI (genuine stress incontinence), female ? ?THERAPY DIAG:  ?Unspecified lack of coordination ? ?Muscle weakness (generalized) ? ?PERTINENT HISTORY: endometrial ablation, severe hemorrage after vaginal delivery ? ?PRECAUTIONS: none ? ?SUBJECTIVE: Nothing new ? ?PAIN:  ?Are you having pain? No ? ?POSTURE:  ?Increased lumbar lordosis ?  ?PALPATION: ?Internal Pelvic Floor Rt side levators and OI tight ?  ?External Perineal Exam minimal to no bulging ?  ?GENERAL lumbar paraspinals and Rt side LE tighter than Left ?  ?LUMBARAROM/PROM ?  ?A/PROM A/PROM  ?09/09/2021  ?Flexion 80%  ?Extension    ?Right lateral flexion    ?Left lateral flexion    ?Right rotation    ?Left rotation    ? (Blank rows = not tested) ?  ?LE AROM/PROM: ?            Hip flexion 90%; ER 75% bil ?  ?  ?  ?LE MMT: ?Grossly 5/5 ?  ?PELVIC MMT: ?  ?MMT   ?09/09/2021  ?Vaginal 2/5 x 1 rep, not relaxing between, 1 sec hold - fatigues quickly  ?Internal Anal Sphincter    ?External Anal Sphincter    ?Puborectalis    ?Diastasis Recti    ?(Blank rows = not tested) ?  ?  ?TONE: ?High Rt>Lt ?  ?PROLAPSE: ?No ?  ?  ?  ?  FUNCTIONAL TESTS:  ?SLS normal ?  ?GAIT: ?  ?Comments: WFL ?  ?  ?  ?TODAY'S TREATMENT  ?Treatment:10/26/21 ?Exercises  ?Rotation in side lying ?Child pose with side bend and rotation ?Cat cow ? ?Manual ?MFR around liver, ascending colon, uterus - uterus remains more stuck during today's treatment ?Trigger Point Dry-Needling  ?Treatment instructions: Expect mild to moderate muscle soreness. S/S of pneumothorax if dry needled over a lung field, and to seek immediate medical attention should they occur. Patient verbalized understanding of these instructions and education. ? ?Patient Consent Given: Yes ?Education handout provided: Previously provided (had DN before but verbally reviewed handout) ?Muscles  treated: lumbar and thoracic multifidi ?Electrical stimulation performed: No ?Parameters: N/A ?Treatment response/outcome: increased muscle length and pliability ? ?Nuero Re-ed ?Education and cues for coordination of breathing and pelvic floor muscle contracting and relaxing at appropriate times ? ? ?  ?PATIENT EDUCATION:  ?Education details: Access Code: [email or web address] and breathing into stretches; DN info verbally reviewed but has had DN before ?Person educated: Patient ?Education method: Explanation, Demonstration, and Verbal cues ?Education comprehension: verbalized understanding and returned demonstration ?  ?  ?HOME EXERCISE PROGRAM: ?Access Code: [email or web address] ?URL: [email or web address] ?Date: 10/26/2021 ?Prepared by: Jari Favre ? ?Exercises ?- Cat Cow  - 1 x daily - 7 x weekly - 3 sets - 10 reps ?- Sidelying Thoracic Lumbar Rotation  - 1 x daily - 7 x weekly - 1 sets - 5 reps - 10 sec hold ?- Child's Pose Stretch  - 1 x daily - 7 x weekly - 1 sets - 3 reps - 30-60 hold ?  ?ASSESSMENT: ?  ?CLINICAL IMPRESSION: ?Today's session included STM and MFR to work on correction of muscle imbalances in order to improve posture  Pt has more tension throughout the Rt side in paraspinals and ribcage is more depressed on the Rt side.  Pt responded well to MFR techniques and paraspinals responded well to DN added to manual techniques.  Rt paraspinals continued to be somewhat locked in lower thoracic region but much better after treatment.  Pt was given initial HEP today and demonstrated she could do all stretches correctly. Pt will benefit from skilled PT to address all above mentioned impairments ?  ?  ?OBJECTIVE IMPAIRMENTS decreased coordination, decreased strength, increased muscle spasms, and impaired tone.  ?  ?ACTIVITY LIMITATIONS community activity and exercise .  ?  ?PERSONAL FACTORS Time since onset of injury/illness/exacerbation and 1 comorbidity: tearing complicated vaginal delivery  are also  affecting patient's functional outcome.  ?  ?  ?REHAB POTENTIAL: Excellent ?  ?CLINICAL DECISION MAKING: Stable/uncomplicated ?  ?EVALUATION COMPLEXITY: Low ?  ?  ?GOALS: ?Goals reviewed with patient? Yes ?  ?SHORT TERM GOALS: ?  ?Pt will be independent with initial HEP ?Baseline: gave today (10/26/21) ? ?Target date: 10/08/2021 ?Goal status: Ongoing ?  ?  ?  ?LONG TERM GOALS: ?  ?Pt will be independent with advanced HEP to maintain improvements made throughout therapy ?  ?Baseline: unknown ?Target date: 12/02/21 ?Goal status: INITIAL ?  ?2.  Pt will be able to functional actions such as jumping without leakage  ?Baseline: cannot do ?Target date: 12/02/21 ?Goal status: INITIAL ?  ?3.  Pt will be able to run/workout for 45 minutes without leakage or discomfort  ?Baseline: cannot do ?Target date: 12/02/21 ?Goal status: INITIAL ?  ?4.  Pt will be able to lift at least 10 lb correctly for 10 reps without pain or leakage for functional activities  ?  ?  Target date: 12/02/21 ?Goal status: INITIAL ?  ?PLAN: ?PT FREQUENCY: 1x/week ?  ?PT DURATION: 12 weeks ?  ?PLANNED INTERVENTIONS: Therapeutic exercises, Therapeutic activity, Neuromuscular re-education, Balance training, Gait training, Patient/Family education, Joint mobilization, Dry Needling, Electrical stimulation, Cryotherapy, Moist heat, Taping, and Manual therapy ?  ?  ?PLAN FOR NEXT SESSION: internal STM to Rt side again possibly after assessing tension or possibly abdominal MFR, f/u on HEP and DN#1, hip rotation rocking and in qped for SI releases ? ?Jule Ser, PT ?10/26/2021, 8:56 AM ? ?  ? ?

## 2021-10-26 ENCOUNTER — Encounter: Payer: Self-pay | Admitting: Physical Therapy

## 2021-10-26 ENCOUNTER — Ambulatory Visit
Payer: No Typology Code available for payment source | Attending: Obstetrics and Gynecology | Admitting: Physical Therapy

## 2021-10-26 DIAGNOSIS — R279 Unspecified lack of coordination: Secondary | ICD-10-CM | POA: Insufficient documentation

## 2021-10-26 DIAGNOSIS — M6281 Muscle weakness (generalized): Secondary | ICD-10-CM | POA: Diagnosis present

## 2021-11-09 ENCOUNTER — Encounter: Payer: No Typology Code available for payment source | Admitting: Physical Therapy

## 2021-11-09 NOTE — Therapy (Deleted)
OUTPATIENT PHYSICAL THERAPY TREATMENT NOTE   Patient Name: MAKALEY STORTS MRN: [id number] DOB:02/18/1981, 41 y.o., female Today's Date: 11/09/2021  PCP: Mckinley Jewel, MD REFERRING PROVIDER: Mckinley Jewel, MD  END OF SESSION:     Past Medical History:  Diagnosis Date   AMA (advanced maternal age) primigravida 24+    Endometriosis    Hypertension    Newborn product of IVF pregnancy    Postpartum care following vaginal delivery (10/14) 04/24/2014   Postpartum hemorrhage, postpartum condition 04/24/2014   Past Surgical History:  Procedure Laterality Date   ABLATION ON ENDOMETRIOSIS     APPENDECTOMY     DILATION AND EVACUATION N/A 04/23/2014   Procedure: DILATATION AND EVACUATION;  Surgeon: Lovenia Kim, MD;  Location: [facility];  Service: Gynecology;  Laterality: N/A;   ENDOMETRIAL ABLATION     EXAMINATION UNDER ANESTHESIA N/A 04/23/2014   Procedure: EXAM UNDER ANESTHESIA placement of Bakri Balloon;  Surgeon: Lovenia Kim, MD;  Location: [facility];  Service: Gynecology;  Laterality: N/A;   mirena     inserted 08-04-20   WISDOM TOOTH EXTRACTION     Patient Active Problem List   Diagnosis Date Noted   Endometriosis 02/13/2020   IUD (intrauterine device) in place 02/13/2020   TMJ (temporomandibular joint syndrome) 11/15/2019   Abnormal auditory perception of left ear 04/26/2018   Chronic hypertension with superimposed preeclampsia 04/25/2014   Hereditary disease in family possibly affecting fetus, affecting management of mother, antepartum condition or complication 46/27/0350   Benign essential hypertension 10/25/2011   Irritable bowel syndrome 10/25/2011   Migraine without aura and without status migrainosus, not intractable 10/25/2011    REFERRING DIAG: N39.3 (ICD-10-CM) - GSI (genuine stress incontinence), female  THERAPY DIAG:  No diagnosis found.  PERTINENT HISTORY: endometrial ablation, severe hemorrage after vaginal delivery  PRECAUTIONS:  none  SUBJECTIVE: Nothing new  PAIN:  Are you having pain? No  POSTURE:  Increased lumbar lordosis   PALPATION: Internal Pelvic Floor Rt side levators and OI tight   External Perineal Exam minimal to no bulging   GENERAL lumbar paraspinals and Rt side LE tighter than Left   LUMBARAROM/PROM   A/PROM A/PROM  09/09/2021  Flexion 80%  Extension    Right lateral flexion    Left lateral flexion    Right rotation    Left rotation     (Blank rows = not tested)   LE AROM/PROM:             Hip flexion 90%; ER 75% bil       LE MMT: Grossly 5/5   PELVIC MMT:   MMT   09/09/2021  Vaginal 2/5 x 1 rep, not relaxing between, 1 sec hold - fatigues quickly  Internal Anal Sphincter    External Anal Sphincter    Puborectalis    Diastasis Recti    (Blank rows = not tested)     TONE: High Rt>Lt   PROLAPSE: No       FUNCTIONAL TESTS:  SLS normal   GAIT:   Comments: WFL       TODAY'S TREATMENT  Treatment:10/26/21 Exercises  Rotation in side lying Child pose with side bend and rotation Cat cow  Manual MFR around liver, ascending colon, uterus - uterus remains more stuck during today's treatment Trigger Point Dry-Needling  Treatment instructions: Expect mild to moderate muscle soreness. S/S of pneumothorax if dry needled over a lung field, and to seek immediate medical attention should they occur. Patient verbalized understanding  of these instructions and education.  Patient Consent Given: Yes Education handout provided: Previously provided (had DN before but verbally reviewed handout) Muscles treated: lumbar and thoracic multifidi Electrical stimulation performed: No Parameters: N/A Treatment response/outcome: increased muscle length and pliability  Nuero Re-ed Education and cues for coordination of breathing and pelvic floor muscle contracting and relaxing at appropriate times     PATIENT EDUCATION:  Education details: Access Code: [email or web address] and breathing  into stretches; DN info verbally reviewed but has had DN before Person educated: Patient Education method: Explanation, Demonstration, and Verbal cues Education comprehension: verbalized understanding and returned demonstration     HOME EXERCISE PROGRAM: Access Code: [email or web address] URL: [email or web address] Date: 10/26/2021 Prepared by: Jari Favre  Exercises - Cat Cow  - 1 x daily - 7 x weekly - 3 sets - 10 reps - Sidelying Thoracic Lumbar Rotation  - 1 x daily - 7 x weekly - 1 sets - 5 reps - 10 sec hold - Child's Pose Stretch  - 1 x daily - 7 x weekly - 1 sets - 3 reps - 30-60 hold   ASSESSMENT:   CLINICAL IMPRESSION: Today's session included STM and MFR to work on correction of muscle imbalances in order to improve posture  Pt has more tension throughout the Rt side in paraspinals and ribcage is more depressed on the Rt side.  Pt responded well to MFR techniques and paraspinals responded well to DN added to manual techniques.  Rt paraspinals continued to be somewhat locked in lower thoracic region but much better after treatment.  Pt was given initial HEP today and demonstrated she could do all stretches correctly. Pt will benefit from skilled PT to address all above mentioned impairments     OBJECTIVE IMPAIRMENTS decreased coordination, decreased strength, increased muscle spasms, and impaired tone.    ACTIVITY LIMITATIONS community activity and exercise .    PERSONAL FACTORS Time since onset of injury/illness/exacerbation and 1 comorbidity: tearing complicated vaginal delivery  are also affecting patient's functional outcome.      REHAB POTENTIAL: Excellent   CLINICAL DECISION MAKING: Stable/uncomplicated   EVALUATION COMPLEXITY: Low     GOALS: Goals reviewed with patient? Yes   SHORT TERM GOALS:   Pt will be independent with initial HEP Baseline: gave today (10/26/21)  Target date: 10/08/2021 Goal status: Ongoing       LONG TERM GOALS:   Pt  will be independent with advanced HEP to maintain improvements made throughout therapy   Baseline: unknown Target date: 12/02/21 Goal status: INITIAL   2.  Pt will be able to functional actions such as jumping without leakage  Baseline: cannot do Target date: 12/02/21 Goal status: INITIAL   3.  Pt will be able to run/workout for 45 minutes without leakage or discomfort  Baseline: cannot do Target date: 12/02/21 Goal status: INITIAL   4.  Pt will be able to lift at least 10 lb correctly for 10 reps without pain or leakage for functional activities    Target date: 12/02/21 Goal status: INITIAL   PLAN: PT FREQUENCY: 1x/week   PT DURATION: 12 weeks   PLANNED INTERVENTIONS: Therapeutic exercises, Therapeutic activity, Neuromuscular re-education, Balance training, Gait training, Patient/Family education, Joint mobilization, Dry Needling, Electrical stimulation, Cryotherapy, Moist heat, Taping, and Manual therapy     PLAN FOR NEXT SESSION: internal STM to Rt side again possibly after assessing tension or possibly abdominal MFR, f/u on HEP and DN#1, hip rotation rocking and in qped for SI releases  Jakki L  Chrysten Woulfe, PT 11/09/2021, 6:09 AM

## 2021-11-25 NOTE — Therapy (Addendum)
OUTPATIENT PHYSICAL THERAPY TREATMENT NOTE   Patient Name: Anna Washington MRN: [id number] DOB:[birth year], 41 y.o., female Today's Date: 11/26/2021  PCP: Mckinley Jewel, MD REFERRING PROVIDER: Salvadore Dom, MD  END OF SESSION:   PT End of Session - 11/26/21 0858     Visit Number 3    Date for PT Re-Evaluation 02/18/22    Authorization Type [employer]    PT Start Time [phone]   late   PT Stop Time 0929    PT Time Calculation (min) 33 min    Activity Tolerance Patient tolerated treatment well    Behavior During Therapy [facility] for tasks assessed/performed              Past Medical History:  Diagnosis Date   AMA (advanced maternal age) primigravida 35+    Endometriosis    Hypertension    Newborn product of IVF pregnancy    Postpartum care following vaginal delivery (10/14) 04/24/2014   Postpartum hemorrhage, postpartum condition 04/24/2014   Past Surgical History:  Procedure Laterality Date   ABLATION ON ENDOMETRIOSIS     APPENDECTOMY     DILATION AND EVACUATION N/A 04/23/2014   Procedure: DILATATION AND EVACUATION;  Surgeon: Lovenia Kim, MD;  Location: [facility];  Service: Gynecology;  Laterality: N/A;   ENDOMETRIAL ABLATION     EXAMINATION UNDER ANESTHESIA N/A 04/23/2014   Procedure: EXAM UNDER ANESTHESIA placement of Bakri Balloon;  Surgeon: Lovenia Kim, MD;  Location: [facility];  Service: Gynecology;  Laterality: N/A;   mirena     inserted 08-04-20   WISDOM TOOTH EXTRACTION     Patient Active Problem List   Diagnosis Date Noted   Endometriosis 02/13/2020   IUD (intrauterine device) in place 02/13/2020   TMJ (temporomandibular joint syndrome) 11/15/2019   Abnormal auditory perception of left ear 04/26/2018   Chronic hypertension with superimposed preeclampsia 04/25/2014   Hereditary disease in family possibly affecting fetus, affecting management of mother, antepartum condition or complication 41/93/7902   Benign essential hypertension 10/25/2011    Irritable bowel syndrome 10/25/2011   Migraine without aura and without status migrainosus, not intractable 10/25/2011    REFERRING DIAG: N39.3 (ICD-10-CM) - GSI (genuine stress incontinence), female  THERAPY DIAG:  Unspecified lack of coordination  Muscle weakness (generalized)  PERTINENT HISTORY: endometrial ablation, severe hemorrage after vaginal delivery  PRECAUTIONS: none  SUBJECTIVE: I leaked a little when running.  Overall, better but still leaking with exercise especially if haven't emptied my bladder.  PAIN:  Are you having pain? No  POSTURE:  Increased lumbar lordosis   PALPATION: Internal Pelvic Floor Rt side levators and OI tight   External Perineal Exam minimal to no bulging   GENERAL lumbar paraspinals and Rt side LE tighter than Left   LUMBARAROM/PROM   A/PROM A/PROM  09/09/2021  Flexion 80%  Extension    Right lateral flexion    Left lateral flexion    Right rotation    Left rotation     (Blank rows = not tested)   LE AROM/PROM:             Hip flexion 90%; ER 75% bil       LE MMT: Grossly 5/5   PELVIC MMT:   MMT   09/09/2021  Vaginal 2/5 x 1 rep, not relaxing between, 1 sec hold - fatigues quickly  Internal Anal Sphincter    External Anal Sphincter    Puborectalis    Diastasis Recti    (Blank rows =  not tested)     TONE: High    PROLAPSE: No       FUNCTIONAL TESTS:  SLS normal   GAIT:   Comments: WFL       TODAY'S TREATMENT  Treatment:11/26/21 Patient confirms identification and approves physical therapist to perform internal soft tissue work   Manual pelvic floor tissues are tender and more engorged on the left side levators; Rt side was tight mildly tender but released well with STM on pressure points Lumbar and thoracic paraspinals Trigger Point Dry-Needling  Treatment instructions: Expect mild to moderate muscle soreness. S/S of pneumothorax if dry needled over a lung field, and to seek immediate medical attention  should they occur. Patient verbalized understanding of these instructions and education.  Patient Consent Given: Yes Education handout provided: Previously provided (had DN before but verbally reviewed handout) Muscles treated: lumbar and thoracic multifidi Electrical stimulation performed: No Parameters: N/A Treatment response/outcome: increased muscle length and pliability Treatment:10/26/21 Exercises  Rotation in side lying Child pose with side bend and rotation Cat cow  Manual MFR around liver, ascending colon, uterus - uterus remains more stuck during today's treatment Trigger Point Dry-Needling  Treatment instructions: Expect mild to moderate muscle soreness. S/S of pneumothorax if dry needled over a lung field, and to seek immediate medical attention should they occur. Patient verbalized understanding of these instructions and education.  Patient Consent Given: Yes Education handout provided: Previously provided (had DN before but verbally reviewed handout) Muscles treated: lumbar and thoracic multifidi Electrical stimulation performed: No Parameters: N/A Treatment response/outcome: increased muscle length and pliability  Nuero Re-ed Education and cues for coordination of breathing and pelvic floor muscle contracting and relaxing at appropriate times     PATIENT EDUCATION:  Education details: Access Code: [email or web address] and breathing into stretches; DN info verbally reviewed but has had DN before Person educated: Patient Education method: Explanation, Demonstration, and Verbal cues Education comprehension: verbalized understanding and returned demonstration     HOME EXERCISE PROGRAM: Access Code: [email or web address] URL: [email or web address] Date: 10/26/2021 Prepared by: Jari Favre  Exercises - Cat Cow  - 1 x daily - 7 x weekly - 3 sets - 10 reps - Sidelying Thoracic Lumbar Rotation  - 1 x daily - 7 x weekly - 1 sets - 5 reps - 10 sec hold - Child's Pose  Stretch  - 1 x daily - 7 x weekly - 1 sets - 3 reps - 30-60 hold   ASSESSMENT:   CLINICAL IMPRESSION: Today's session included continued STM and MFR to work on correction of muscle imbalances in order to improve posture  Pt has more tension throughout the Rt side paraspinals.  Pt responded well to MFR techniques and paraspinals responded well to DN added to manual techniques.  Re-assessed and treated pelvic floor tissues are tender and more engorged on the left side levators; Rt side was tight mildly tender but released well with STM on pressure points. Pt will benefit from skilled PT to address all above mentioned impairments for full return to exercise without pain or leakage     OBJECTIVE IMPAIRMENTS decreased coordination, decreased strength, increased muscle spasms, and impaired tone.    ACTIVITY LIMITATIONS community activity and exercise .    PERSONAL FACTORS Time since onset of injury/illness/exacerbation and 1 comorbidity: tearing complicated vaginal delivery  are also affecting patient's functional outcome.      REHAB POTENTIAL: Excellent   CLINICAL DECISION MAKING: Stable/uncomplicated   EVALUATION COMPLEXITY: Low     GOALS: Goals reviewed with  patient? Yes   SHORT TERM GOALS:   Pt will be independent with initial HEP Baseline: gave today (10/26/21)  Target date: 10/08/2021 Goal status: Ongoing       LONG TERM GOALS:   Pt will be independent with advanced HEP to maintain improvements made throughout therapy   Baseline: unknown Target date: 02/18/22 Goal status: ongoing   2.  Pt will be able to functional actions such as jumping without leakage  Baseline: cannot do without voiding immediately before, but was abe to 10-15 reps during a HIIT class Target date: 02/18/22 Goal status: Ongoing   3.  Pt will be able to run/workout for 45 minutes without leakage or discomfort  Baseline: a little leakage throughout Target date: 02/18/22 Goal status: Ongoing   4.  Pt  will be able to lift at least 10 lb correctly for 10 reps without pain or leakage for functional activities    Target date: 02/18/22 Goal status: MET   5. Pt will be able to tolerate internal soft tissue work for pain free annual exams as part of general wellness.  [place] date 02/18/22  Status: New PLAN: PT FREQUENCY: 1x/week   PT DURATION: 12 weeks   PLANNED INTERVENTIONS: Therapeutic exercises, Therapeutic activity, Neuromuscular re-education, Balance training, Gait training, Patient/Family education, Joint mobilization, Dry Needling, Electrical stimulation, Cryotherapy, Moist heat, Taping, and Manual therapy     PLAN FOR NEXT SESSION: internal STM possibly f/u on pelvic wand, and DN#3, pelvic and thoracic rotation Camillo Flaming Shany Marinez, PT 11/26/2021, 8:58 AM    PHYSICAL THERAPY DISCHARGE SUMMARY  Visits from Start of Care: 3  Current functional level related to goals / functional outcomes: See above details   Remaining deficits: See above   Education / Equipment: HEP   Patient agrees to discharge. Patient goals were partially met. Patient is being discharged due to not returning since the last visit.  Gustavus Bryant, PT 03/15/22 2:40 PM

## 2021-11-26 ENCOUNTER — Encounter: Payer: Self-pay | Admitting: Physical Therapy

## 2021-11-26 ENCOUNTER — Ambulatory Visit
Payer: No Typology Code available for payment source | Attending: Obstetrics and Gynecology | Admitting: Physical Therapy

## 2021-11-26 DIAGNOSIS — R279 Unspecified lack of coordination: Secondary | ICD-10-CM | POA: Insufficient documentation

## 2021-11-26 DIAGNOSIS — M6281 Muscle weakness (generalized): Secondary | ICD-10-CM | POA: Insufficient documentation

## 2021-12-24 ENCOUNTER — Ambulatory Visit: Payer: No Typology Code available for payment source | Admitting: Physical Therapy

## 2021-12-29 ENCOUNTER — Other Ambulatory Visit (HOSPITAL_COMMUNITY): Payer: Self-pay

## 2021-12-29 MED ORDER — VYVANSE 20 MG PO CAPS
20.0000 mg | ORAL_CAPSULE | Freq: Every day | ORAL | 0 refills | Status: DC
  Filled 2021-12-29: qty 30, 30d supply, fill #0

## 2021-12-30 ENCOUNTER — Other Ambulatory Visit (HOSPITAL_COMMUNITY): Payer: Self-pay

## 2021-12-31 ENCOUNTER — Other Ambulatory Visit (HOSPITAL_COMMUNITY): Payer: Self-pay

## 2021-12-31 MED ORDER — ALPRAZOLAM 0.25 MG PO TABS
0.2500 mg | ORAL_TABLET | ORAL | 0 refills | Status: DC | PRN
  Filled 2021-12-31: qty 10, 10d supply, fill #0

## 2022-02-07 ENCOUNTER — Other Ambulatory Visit (HOSPITAL_COMMUNITY): Payer: Self-pay

## 2022-02-07 MED ORDER — VYVANSE 30 MG PO CAPS
30.0000 mg | ORAL_CAPSULE | Freq: Every day | ORAL | 0 refills | Status: DC
  Filled 2022-02-07: qty 30, 30d supply, fill #0

## 2022-02-10 ENCOUNTER — Other Ambulatory Visit (HOSPITAL_COMMUNITY): Payer: Self-pay

## 2022-03-15 ENCOUNTER — Other Ambulatory Visit (HOSPITAL_COMMUNITY): Payer: Self-pay

## 2022-03-15 MED ORDER — MUPIROCIN 2 % EX OINT
1.0000 | TOPICAL_OINTMENT | Freq: Two times a day (BID) | CUTANEOUS | 0 refills | Status: DC
  Filled 2022-03-15 – 2022-03-25 (×2): qty 22, 11d supply, fill #0

## 2022-03-23 ENCOUNTER — Other Ambulatory Visit (HOSPITAL_COMMUNITY): Payer: Self-pay

## 2022-03-25 ENCOUNTER — Other Ambulatory Visit (HOSPITAL_COMMUNITY): Payer: Self-pay

## 2022-03-29 ENCOUNTER — Other Ambulatory Visit (HOSPITAL_COMMUNITY): Payer: Self-pay

## 2022-03-29 ENCOUNTER — Encounter: Payer: Self-pay | Admitting: Obstetrics and Gynecology

## 2022-03-29 ENCOUNTER — Ambulatory Visit (INDEPENDENT_AMBULATORY_CARE_PROVIDER_SITE_OTHER): Payer: No Typology Code available for payment source | Admitting: Obstetrics and Gynecology

## 2022-03-29 VITALS — BP 138/88 | HR 78 | Ht 68.0 in | Wt 128.4 lb

## 2022-03-29 DIAGNOSIS — L739 Follicular disorder, unspecified: Secondary | ICD-10-CM

## 2022-03-29 DIAGNOSIS — L814 Other melanin hyperpigmentation: Secondary | ICD-10-CM | POA: Insufficient documentation

## 2022-03-29 DIAGNOSIS — B3731 Acute candidiasis of vulva and vagina: Secondary | ICD-10-CM | POA: Diagnosis not present

## 2022-03-29 DIAGNOSIS — D2271 Melanocytic nevi of right lower limb, including hip: Secondary | ICD-10-CM | POA: Insufficient documentation

## 2022-03-29 DIAGNOSIS — Z30431 Encounter for routine checking of intrauterine contraceptive device: Secondary | ICD-10-CM

## 2022-03-29 DIAGNOSIS — R102 Pelvic and perineal pain: Secondary | ICD-10-CM

## 2022-03-29 DIAGNOSIS — N898 Other specified noninflammatory disorders of vagina: Secondary | ICD-10-CM | POA: Diagnosis not present

## 2022-03-29 DIAGNOSIS — L578 Other skin changes due to chronic exposure to nonionizing radiation: Secondary | ICD-10-CM | POA: Insufficient documentation

## 2022-03-29 LAB — WET PREP FOR TRICH, YEAST, CLUE

## 2022-03-29 MED ORDER — MUPIROCIN 2 % EX OINT
1.0000 | TOPICAL_OINTMENT | Freq: Three times a day (TID) | CUTANEOUS | 0 refills | Status: AC
  Filled 2022-03-29: qty 22, 7d supply, fill #0

## 2022-03-29 MED ORDER — FLUCONAZOLE 150 MG PO TABS
150.0000 mg | ORAL_TABLET | Freq: Once | ORAL | 0 refills | Status: AC
  Filled 2022-03-29: qty 2, 3d supply, fill #0

## 2022-03-29 NOTE — Progress Notes (Signed)
GYNECOLOGY  VISIT   HPI: 41 y.o.   Married White or Caucasian Not Hispanic or Latino  female   G1P1001 with No LMP recorded. (Menstrual status: IUD).   here c/o right lower quadrant pain.   She reports a 2-3 month h/o intermittent sharp or aching pain in the RLQ. The pain can last for a few seconds to an hour, occurs many times over a few days, then goes away. She hasn't tracked the pain, but thinks it's happening at least once a month, but the quick pains may occur in addition to the monthly pain. Pain is typically in the range of 3-4, occasionally a quick sharp pain can be an 8-9, only lasts a few seconds.  No constipation, diarrhea or bloating in association with the pain. She had some bloating, but it resolved. Normal BM qd. Voiding fine.   Sexually active, feels like she has less sensation than normal. She has intermittent deep dyspareunia on the right (long term, no change).   She is not having cycles, a few days ago she had some spotting.   She c/o some vaginal dryness, irritation for ~ 3 months. Some itching on the mons.  She has tried replens, hasn't helped.   She has a mirena IUD, placed in 1/22.  H/o endometriosis, had surgery at [facility] in 2013.   GYNECOLOGIC HISTORY: No LMP recorded. (Menstrual status: IUD). Contraception:IUD  Menopausal hormone therapy: none         OB History     Gravida  1   Para  1   Term  1   Preterm      AB      Living  1      SAB      IAB      Ectopic      Multiple      Live Births  1              Patient Active Problem List   Diagnosis Date Noted   Endometriosis 02/13/2020   IUD (intrauterine device) in place 02/13/2020   TMJ (temporomandibular joint syndrome) 11/15/2019   Abnormal auditory perception of left ear 04/26/2018   Chronic hypertension with superimposed preeclampsia 04/25/2014   Hereditary disease in family possibly affecting fetus, affecting management of mother, antepartum condition or complication  20/25/4270   Benign essential hypertension 10/25/2011   Irritable bowel syndrome 10/25/2011   Migraine without aura and without status migrainosus, not intractable 10/25/2011    Past Medical History:  Diagnosis Date   AMA (advanced maternal age) primigravida 35+    Endometriosis    Hypertension    Newborn product of IVF pregnancy    Postpartum care following vaginal delivery (10/14) 04/24/2014   Postpartum hemorrhage, postpartum condition 04/24/2014    Past Surgical History:  Procedure Laterality Date   ABLATION ON ENDOMETRIOSIS     APPENDECTOMY     DILATION AND EVACUATION N/A 04/23/2014   Procedure: DILATATION AND EVACUATION;  Surgeon: Lovenia Kim, MD;  Location: [facility];  Service: Gynecology;  Laterality: N/A;   ENDOMETRIAL ABLATION     EXAMINATION UNDER ANESTHESIA N/A 04/23/2014   Procedure: EXAM UNDER ANESTHESIA placement of Bakri Balloon;  Surgeon: Lovenia Kim, MD;  Location: [facility];  Service: Gynecology;  Laterality: N/A;   mirena     inserted 08-04-20   WISDOM TOOTH EXTRACTION      Current Outpatient Medications  Medication Sig Dispense Refill   ALPRAZolam (XANAX) 0.25 MG tablet Take 1 tablet (0.25  mg total) by mouth 2 (two) times daily as needed for anxiety. 20 tablet 0   ALPRAZolam (XANAX) 0.25 MG tablet Take 1 tablet (0.25 mg total) by mouth as needed for flying. 10 tablet 0   cetirizine (ZYRTEC) 10 MG tablet Take by mouth.     levonorgestrel (MIRENA) 20 MCG/24HR IUD 1 each by Intrauterine route once.     lisdexamfetamine (VYVANSE) 30 MG capsule Take 1 capsule (30 mg total) by mouth daily. 30 capsule 0   mupirocin ointment (BACTROBAN) 2 % Apply 1 application (a small amount) topically 2 (two) times daily to the affected area 22 g 0   No current facility-administered medications for this visit.     ALLERGIES: Penicillins  Family History  Problem Relation Age of Onset   Cancer Maternal Grandmother 63       breast   Breast cancer Maternal Grandmother 65    Heart attack Maternal Grandfather 45   Diabetes Paternal Grandmother    Diabetes Paternal Grandfather    Cancer Paternal Grandfather        prostate   Hyperlipidemia Father     Social History   Socioeconomic History   Marital status: Married    Spouse name: Not on file   Number of children: Not on file   Years of education: Not on file   Highest education level: Not on file  Occupational History   Not on file  Tobacco Use   Smoking status: Never   Smokeless tobacco: Never  Vaping Use   Vaping Use: Never used  Substance and Sexual Activity   Alcohol use: Yes    Comment: social   Drug use: No   Sexual activity: Yes    Birth control/protection: I.U.D.  Other Topics Concern   Not on file  Social History Narrative   Not on file   Social Determinants of Health   Financial Resource Strain: Not on file  Food Insecurity: Not on file  Transportation Needs: Not on file  Physical Activity: Not on file  Stress: Not on file  Social Connections: Not on file  Intimate Partner Violence: Not on file    Review of Systems  Gastrointestinal:  Positive for abdominal pain.  All other systems reviewed and are negative.   PHYSICAL EXAMINATION:    BP 138/88   Pulse 78   Ht '5\' 8"'$  (1.727 m)   Wt 128 lb 6.4 oz (58.2 kg)   SpO2 100%   BMI 19.52 kg/m    Repeate BP 136/82 General appearance: alert, cooperative and appears stated age Abdomen: soft, non-tender; non distended, no masses,  no organomegaly  Pelvic: External genitalia:  no lesions, minimal erythema  Mons: folliculitis noted              Urethra:  normal appearing urethra with no masses, tenderness or lesions              Bartholins and Skenes: normal                 Vagina: normal appearing vagina with normal color and discharge, no lesions              Cervix: no cervical motion tenderness, no lesions, and IUD strings 3 cm              Bimanual Exam:  Uterus:  normal size, contour, position, consistency, mobility,  non-tender and retroverted              Adnexa: no mass, fullness, tenderness  Rectovaginal: Yes.  .  Confirms.              Anus:  normal sphincter tone, no lesions  Bladder: not tender  Pelvic floor: not tender  Chaperone was present for exam.  1. Combined abdominal and pelvic pain No concerning findings on exam - US PELVIS TRANSVAGINAL NON-OB (TV ONLY); Future -Calendar pain  2. Vaginal irritation - WET PREP FOR TRICH, YEAST, CLUE  3. Yeast vaginitis - fluconazole (DIFLUCAN) 150 MG tablet; Take 1 tablet (150 mg total) by mouth once for 1 dose. Take one tablet.  Repeat in 72 hours if symptoms are not completely resolved.  Dispense: 2 tablet; Refill: 0  4. Folliculitis - mupirocin ointment (BACTROBAN) 2 %; Apply 1 Application topically 3 (three) times daily. Use x 7 days  Dispense: 22 g; Refill: 0  5. IUD check up Normal exam  ~30 minutes spent in total patient care.

## 2022-03-29 NOTE — Patient Instructions (Signed)
Folliculitis  Folliculitis is inflammation of the hair follicles. Folliculitis most commonly occurs on the scalp, thighs, legs, back, and buttocks. However, it can occur anywhere on the body. What are the causes? This condition may be caused by: A bacterial infection (common). A fungal infection. A viral infection. Contact with certain chemicals, especially oils and tars. Shaving or waxing. Greasy ointments or creams applied to the skin. Long-lasting folliculitis and folliculitis that keeps coming back may be caused by bacteria. This bacteria can live anywhere on your skin and is often found in the nostrils. What increases the risk? You are more likely to develop this condition if you have: A weakened immune system. Diabetes. Obesity. What are the signs or symptoms? Symptoms of this condition include: Redness. Soreness. Swelling. Itching. Small white or yellow, pus-filled, itchy spots (pustules) that appear over a reddened area. If there is an infection that goes deep into the follicle, these may develop into a boil (furuncle). A group of closely packed boils (carbuncle). These tend to form in hairy, sweaty areas of the body. How is this diagnosed? This condition is diagnosed with a skin exam. To find what is causing the condition, your health care provider may take a sample of one of the pustules or boils for testing in a lab. How is this treated? This condition may be treated by: Applying warm compresses to the affected areas. Taking an antibiotic medicine or applying an antibiotic medicine to the skin. Applying or bathing with an antiseptic solution. Taking an over-the-counter medicine to help with itching. Having a procedure to drain any pustules or boils. This may be done if a pustule or boil contains a lot of pus or fluid. Having laser hair removal. This may be done to treat long-lasting folliculitis. Follow these instructions at home: Managing pain and swelling  If  directed, apply heat to the affected area as often as told by your health care provider. Use the heat source that your health care provider recommends, such as a moist heat pack or a heating pad. Place a towel between your skin and the heat source. Leave the heat on for 20-30 minutes. Remove the heat if your skin turns bright red. This is especially important if you are unable to feel pain, heat, or cold. You may have a greater risk of getting burned. General instructions If you were prescribed an antibiotic medicine, take it or apply it as told by your health care provider. Do not stop using the antibiotic even if your condition improves. Check the irritated area every day for signs of infection. Check for: Redness, swelling, or pain. Fluid or blood. Warmth. Pus or a bad smell. Do not shave irritated skin. Take over-the-counter and prescription medicines only as told by your health care provider. Keep all follow-up visits as told by your health care provider. This is important. Get help right away if: You have more redness, swelling, or pain in the affected area. Red streaks are spreading from the affected area. You have a fever. Summary Folliculitis is inflammation of the hair follicles. Folliculitis most commonly occurs on the scalp, thighs, legs, back, and buttocks. This condition may be treated by taking an antibiotic medicine or applying an antibiotic medicine to the skin, and applying or bathing with an antiseptic solution. If you were prescribed an antibiotic medicine, take it or apply it as told by your health care provider. Do not stop using the antibiotic even if your condition improves. Get help right away if you have new  or worsening symptoms. Keep all follow-up visits as told by your health care provider. This is important. This information is not intended to replace advice given to you by your health care provider. Make sure you discuss any questions you have with your health  care provider. Document Revised: 09/01/2021 Document Reviewed: 04/22/2021 Elsevier Patient Education  [place]. Vaginal Yeast Infection, Adult  Vaginal yeast infection is a condition that causes vaginal discharge as well as soreness, swelling, and redness (inflammation) of the vagina. This is a common condition. Some women get this infection frequently. What are the causes? This condition is caused by a change in the normal balance of the yeast (Candida) and normal bacteria that live in the vagina. This change causes an overgrowth of yeast, which causes the inflammation. What increases the risk? The condition is more likely to develop in women who: Take antibiotic medicines. Have diabetes. Take birth control pills. Are pregnant. Douche often. Have a weak body defense system (immune system). Have been taking steroid medicines for a long time. Frequently wear tight clothing. What are the signs or symptoms? Symptoms of this condition include: White, thick, creamy vaginal discharge. Swelling, itching, redness, and irritation of the vagina. The lips of the vagina (labia) may be affected as well. Pain or a burning feeling while urinating. Pain during sex. How is this diagnosed? This condition is diagnosed based on: Your medical history. A physical exam. A pelvic exam. Your health care provider will examine a sample of your vaginal discharge under a microscope. Your health care provider may send this sample for testing to confirm the diagnosis. How is this treated? This condition is treated with medicine. Medicines may be over-the-counter or prescription. You may be told to use one or more of the following: Medicine that is taken by mouth (orally). Medicine that is applied as a cream (topically). Medicine that is inserted directly into the vagina (suppository). Follow these instructions at home: Take or apply over-the-counter and prescription medicines only as told by your  health care provider. Do not use tampons until your health care provider approves. Do not have sex until your infection has cleared. Sex can prolong or worsen your symptoms of infection. Ask your health care provider when it is safe to resume sexual activity. Keep all follow-up visits. This is important. How is this prevented?  Do not wear tight clothes, such as pantyhose or tight pants. Wear breathable cotton underwear. Do not use douches, perfumed soap, creams, or powders. Wipe from front to back after using the toilet. If you have diabetes, keep your blood sugar levels under control. Ask your health care provider for other ways to prevent yeast infections. Contact a health care provider if: You have a fever. Your symptoms go away and then return. Your symptoms do not get better with treatment. Your symptoms get worse. You have new symptoms. You develop blisters in or around your vagina. You have blood coming from your vagina and it is not your menstrual period. You develop pain in your abdomen. Summary Vaginal yeast infection is a condition that causes discharge as well as soreness, swelling, and redness (inflammation) of the vagina. This condition is treated with medicine. Medicines may be over-the-counter or prescription. Take or apply over-the-counter and prescription medicines only as told by your health care provider. Do not douche. Resume sexual activity or use of tampons as instructed by your health care provider. Contact a health care provider if your symptoms do not get better with treatment or your  symptoms go away and then return. This information is not intended to replace advice given to you by your health care provider. Make sure you discuss any questions you have with your health care provider. Document Revised: 09/14/2020 Document Reviewed: 09/14/2020 Elsevier Patient Education  [place].

## 2022-03-30 ENCOUNTER — Ambulatory Visit: Payer: No Typology Code available for payment source | Admitting: Obstetrics and Gynecology

## 2022-03-30 ENCOUNTER — Other Ambulatory Visit (HOSPITAL_COMMUNITY): Payer: Self-pay

## 2022-04-05 ENCOUNTER — Other Ambulatory Visit (HOSPITAL_COMMUNITY): Payer: Self-pay

## 2022-04-14 ENCOUNTER — Other Ambulatory Visit (HOSPITAL_COMMUNITY): Payer: Self-pay

## 2022-04-14 ENCOUNTER — Ambulatory Visit: Payer: No Typology Code available for payment source

## 2022-04-14 DIAGNOSIS — R102 Pelvic and perineal pain: Secondary | ICD-10-CM | POA: Diagnosis not present

## 2022-04-14 DIAGNOSIS — R109 Unspecified abdominal pain: Secondary | ICD-10-CM | POA: Diagnosis not present

## 2022-04-14 MED ORDER — SODIUM FLUORIDE 1.1 % DT PSTE
PASTE | DENTAL | 12 refills | Status: DC
  Filled 2022-04-14: qty 100, 30d supply, fill #0

## 2022-04-15 ENCOUNTER — Other Ambulatory Visit (HOSPITAL_COMMUNITY): Payer: Self-pay

## 2022-04-15 MED ORDER — LISDEXAMFETAMINE DIMESYLATE 30 MG PO CAPS
30.0000 mg | ORAL_CAPSULE | Freq: Every day | ORAL | 0 refills | Status: DC
  Filled 2022-04-15: qty 30, 30d supply, fill #0

## 2022-04-19 ENCOUNTER — Telehealth: Payer: Self-pay | Admitting: Obstetrics and Gynecology

## 2022-04-19 DIAGNOSIS — R102 Pelvic and perineal pain: Secondary | ICD-10-CM

## 2022-04-19 NOTE — Telephone Encounter (Signed)
Please let the patient know that I have reviewed her ultrasound from 04/14/22. Overall it is reassuring, IUD is in the proper location. She has a small cyst in her right ovary that is likely physiologic, but I would recommend a f/u u/s in 3 months to make sure it resolves. She has some free fluid around her right ovary, this could imply a ruptured cyst. A ruptured cyst could hurt for a few days, not for a few months. I don't see anything obvious to explain intermittent pain for several months. Please set her up for a f/u u/s in 3 months (I have placed the order).

## 2022-04-19 NOTE — Telephone Encounter (Signed)
I spoke with patient and read her Dr. Gentry Fitz message regarding results/recommendations. She voice understanding and had no questions for Dr. Talbert Nan.  She knows someone from appointments will call her schedule the u/s in 3 months.

## 2022-04-19 NOTE — Telephone Encounter (Signed)
Left message in voice mail for patient to call.

## 2022-04-19 NOTE — Telephone Encounter (Signed)
Patient called in voice mail. I returned her call and left message in voice mail.

## 2022-04-25 NOTE — Telephone Encounter (Signed)
Anna [place], [place] Left 2 messages for patient to call and schedule appointment; patient has not called back I have placed a recall for January

## 2022-05-12 ENCOUNTER — Other Ambulatory Visit (HOSPITAL_COMMUNITY): Payer: Self-pay

## 2022-05-12 MED ORDER — LISDEXAMFETAMINE DIMESYLATE 30 MG PO CAPS
30.0000 mg | ORAL_CAPSULE | Freq: Every day | ORAL | 0 refills | Status: DC
  Filled 2022-05-24 – 2022-06-09 (×2): qty 30, 30d supply, fill #0

## 2022-05-23 ENCOUNTER — Other Ambulatory Visit (HOSPITAL_COMMUNITY): Payer: Self-pay

## 2022-05-23 MED ORDER — ALPRAZOLAM 0.25 MG PO TABS
0.2500 mg | ORAL_TABLET | ORAL | 0 refills | Status: DC | PRN
  Filled 2022-05-23: qty 10, 10d supply, fill #0

## 2022-05-24 ENCOUNTER — Other Ambulatory Visit (HOSPITAL_COMMUNITY): Payer: Self-pay

## 2022-06-03 ENCOUNTER — Other Ambulatory Visit (HOSPITAL_COMMUNITY): Payer: Self-pay

## 2022-06-09 ENCOUNTER — Other Ambulatory Visit (HOSPITAL_COMMUNITY): Payer: Self-pay

## 2022-07-13 NOTE — Progress Notes (Signed)
42 y.o. G24P1001 Married White or Caucasian Not Hispanic or Latino female here for annual exam.  She has a mirena IUD, placed in 1/22. No bleeding. She is having some vaginal dryness, it comes and goes but is present a lot. She has tried vaginal moisturizers and lubrication. She is sexually active, dry even with lubrication.  She has no sex drive. Harder to have an orgasm.   Occasional night sweats for a few years. No hot flashes.    H/O endometriosis.   No LMP recorded. (Menstrual status: IUD).          Sexually active: Yes.    The current method of family planning is IUD.    Exercising: Yes.     Yoga cardio walking 3-4 miles  Smoker:  no  Health Maintenance: Pap:   06/17/20 WNL Hr HPV Neg   History of abnormal Pap:  no MMG:  08/26/21 Bi-rads 1 neg  BMD:   n/a Colonoscopy: n/a TDaP:  03/06/14 Gardasil: none    reports that she has never smoked. She has never used smokeless tobacco. She reports current alcohol use. She reports that she does not use drugs. Just occasional ETOH. 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 She has an 44 year old daughter, in second grade (Spanish immersion program).    Past Medical History:  Diagnosis Date   AMA (advanced maternal age) primigravida 68+    Endometriosis    Hypertension    Newborn product of IVF pregnancy    Postpartum care following vaginal delivery (10/14) 04/24/2014   Postpartum hemorrhage, postpartum condition 04/24/2014    Past Surgical History:  Procedure Laterality Date   ABLATION ON ENDOMETRIOSIS     APPENDECTOMY     DILATION AND EVACUATION N/A 04/23/2014   Procedure: DILATATION AND EVACUATION;  Surgeon: Lovenia Kim, MD;  Location: [facility];  Service: Gynecology;  Laterality: N/A;   ENDOMETRIAL ABLATION     EXAMINATION UNDER ANESTHESIA N/A 04/23/2014   Procedure: EXAM UNDER ANESTHESIA placement of Bakri Balloon;  Surgeon: Lovenia Kim, MD;  Location: [facility];  Service: Gynecology;  Laterality:  N/A;   mirena     inserted 08-04-20   WISDOM TOOTH EXTRACTION      Current Outpatient Medications  Medication Sig Dispense Refill   ALPRAZolam (XANAX) 0.25 MG tablet Take 1 tablet (0.25 mg total) by mouth 2 (two) times daily as needed for anxiety. 20 tablet 0   ALPRAZolam (XANAX) 0.25 MG tablet Take 1 tablet (0.25 mg total) by mouth as needed for flying. 10 tablet 0   ALPRAZolam (XANAX) 0.25 MG tablet Take 1 tablet (0.25 mg total) by mouth as needed for flying for 10 days 10 tablet 0   cetirizine (ZYRTEC) 10 MG tablet Take by mouth.     levonorgestrel (MIRENA) 20 MCG/24HR IUD 1 each by Intrauterine route once.     lisdexamfetamine (VYVANSE) 30 MG capsule Take 1 capsule (30 mg total) by mouth daily. 30 capsule 0   Sodium Fluoride 1.1 % PSTE Apply  a thin ribbon/pea sized amount to toothbrush, brush teeth thoroughly for at least 2 minutes, use in place of regular toothpaste. Spit, do not swallow. 100 mL 12   Multiple Vitamin (MULTI VITAMIN) TABS 1 tablet Orally Once a day for 30 day(s)     No current facility-administered medications for this visit.    Family History  Problem Relation Age of Onset   Cancer Maternal Grandmother 61  breast   Breast cancer Maternal Grandmother 82   Heart attack Maternal Grandfather 45   Diabetes Paternal Grandmother    Diabetes Paternal Grandfather    Cancer Paternal Grandfather        prostate   Hyperlipidemia Father     Review of Systems  All other systems reviewed and are negative.   Exam:   BP 124/72   Pulse 66   Ht '5\' 7"'$  (1.702 m)   Wt 127 lb (57.6 kg)   SpO2 100%   BMI 19.89 kg/m   Weight change: '@WEIGHTCHANGE'$ @ Height:   Height: '5\' 7"'$  (170.2 cm)  Ht Readings from Last 3 Encounters:  07/21/22 '5\' 7"'$  (1.702 m)  03/29/22 '5\' 8"'$  (1.727 m)  07/27/21 5' 7.5" (1.715 m)    General appearance: alert, cooperative and appears stated age Head: Normocephalic, without obvious abnormality, atraumatic Neck: no adenopathy, supple, symmetrical,  trachea midline and thyroid normal to inspection and palpation Lungs: clear to auscultation bilaterally Cardiovascular: regular rate and rhythm Breasts: normal appearance, no masses or tenderness Abdomen: soft, non-tender; non distended,  no masses,  no organomegaly Extremities: extremities normal, atraumatic, no cyanosis or edema Skin: Skin color, texture, turgor normal. No rashes or lesions Lymph nodes: Cervical, supraclavicular, and axillary nodes normal. No abnormal inguinal nodes palpated Neurologic: Grossly normal   Pelvic: External genitalia:  no lesions              Urethra:  normal appearing urethra with no masses, tenderness or lesions              Bartholins and Skenes: normal                 Vagina: normal appearing vagina with normal color and discharge, no lesions. Minimal atrophy.              Cervix: no lesions and IUD strings 2-3 cm               Bimanual Exam:  Uterus:  normal size, contour, position, consistency, mobility, non-tender and retroverted              Adnexa: no mass, fullness, tenderness               Rectovaginal: declines  Lovena Le, CMA chaperoned for the exam.  1. Well woman exam Discussed breast self exam Discussed calcium and vit D intake No pap this year Mammogram due next month No screening labs this year  2. Elevated LDL cholesterol level - Lipid panel  3. Vitamin D deficiency On multivit and vit d - VITAMIN D 25 Hydroxy (Vit-D Deficiency, Fractures)  4. Vaginal dryness - Follicle stimulating hormone -Has tried moisturizers, lubricant -Discussed vaginal estrogen. She will consider.   5. Libido, decreased Discussed libido, information given -FSH - Testos,Total,Free and SHBG (Female). -We discussed that there is no FDA approved treatment for libido in women. I wouldn't recommend off label testosterone in a premenopausal patient.   H/O ovarian cyst, u/s scheduled for later this month.

## 2022-07-21 ENCOUNTER — Encounter: Payer: Self-pay | Admitting: Obstetrics and Gynecology

## 2022-07-21 ENCOUNTER — Ambulatory Visit (INDEPENDENT_AMBULATORY_CARE_PROVIDER_SITE_OTHER): Payer: 59 | Admitting: Obstetrics and Gynecology

## 2022-07-21 VITALS — BP 124/72 | HR 66 | Ht 67.0 in | Wt 127.0 lb

## 2022-07-21 DIAGNOSIS — E7800 Pure hypercholesterolemia, unspecified: Secondary | ICD-10-CM | POA: Diagnosis not present

## 2022-07-21 DIAGNOSIS — E559 Vitamin D deficiency, unspecified: Secondary | ICD-10-CM

## 2022-07-21 DIAGNOSIS — N898 Other specified noninflammatory disorders of vagina: Secondary | ICD-10-CM | POA: Diagnosis not present

## 2022-07-21 DIAGNOSIS — Z01419 Encounter for gynecological examination (general) (routine) without abnormal findings: Secondary | ICD-10-CM

## 2022-07-21 DIAGNOSIS — R6882 Decreased libido: Secondary | ICD-10-CM

## 2022-07-21 NOTE — Patient Instructions (Signed)

## 2022-07-25 ENCOUNTER — Other Ambulatory Visit (HOSPITAL_COMMUNITY): Payer: Self-pay

## 2022-07-26 ENCOUNTER — Other Ambulatory Visit (HOSPITAL_COMMUNITY): Payer: Self-pay

## 2022-07-26 LAB — LIPID PANEL
Cholesterol: 202 mg/dL — ABNORMAL HIGH (ref <200)
HDL: 65 mg/dL (ref >=50)
LDL Cholesterol (Calc): 122 mg/dL (calc) — ABNORMAL HIGH
Non-HDL Cholesterol (Calc): 137 mg/dL (calc) — ABNORMAL HIGH (ref <130)
Total CHOL/HDL Ratio: 3.1 (calc) (ref <5.0)
Triglycerides: 60 mg/dL (ref <150)

## 2022-07-26 LAB — TESTOS,TOTAL,FREE AND SHBG (FEMALE)
Free Testosterone: 1.3 pg/mL (ref 0.1–6.4)
Sex Hormone Binding: 57.7 nmol/L (ref 17–124)
Testosterone, Total, LC-MS-MS: 17 ng/dL (ref 2–45)

## 2022-07-26 LAB — FOLLICLE STIMULATING HORMONE: FSH: 8.8 m[IU]/mL

## 2022-07-26 LAB — VITAMIN D 25 HYDROXY (VIT D DEFICIENCY, FRACTURES): Vit D, 25-Hydroxy: 27 ng/mL — ABNORMAL LOW (ref 30–100)

## 2022-08-04 ENCOUNTER — Ambulatory Visit (INDEPENDENT_AMBULATORY_CARE_PROVIDER_SITE_OTHER): Payer: 59

## 2022-08-04 ENCOUNTER — Ambulatory Visit (INDEPENDENT_AMBULATORY_CARE_PROVIDER_SITE_OTHER): Payer: 59 | Admitting: Obstetrics and Gynecology

## 2022-08-04 DIAGNOSIS — R109 Unspecified abdominal pain: Secondary | ICD-10-CM

## 2022-08-04 DIAGNOSIS — R102 Pelvic and perineal pain: Secondary | ICD-10-CM | POA: Diagnosis not present

## 2022-08-04 NOTE — Progress Notes (Signed)
GYNECOLOGY  VISIT   HPI: 42 y.o.   Married White or Caucasian Not Hispanic or Latino  female   G1P1001 with No LMP recorded. (Menstrual status: IUD).   here for a f/u u/s secondary to a h/o a septated right ovarian cyst.  Her ultrasound was normal, she was informed but not seen for a visit.   See full u/s report.  H/O ovarian cyst, resolved. Routine f/u

## 2022-08-05 ENCOUNTER — Other Ambulatory Visit (HOSPITAL_COMMUNITY): Payer: Self-pay

## 2022-08-05 MED ORDER — LISDEXAMFETAMINE DIMESYLATE 30 MG PO CAPS
30.0000 mg | ORAL_CAPSULE | Freq: Every day | ORAL | 0 refills | Status: DC
  Filled 2022-08-05: qty 30, 30d supply, fill #0

## 2022-08-12 DIAGNOSIS — Z79899 Other long term (current) drug therapy: Secondary | ICD-10-CM | POA: Diagnosis not present

## 2022-08-12 DIAGNOSIS — F902 Attention-deficit hyperactivity disorder, combined type: Secondary | ICD-10-CM | POA: Diagnosis not present

## 2022-08-12 DIAGNOSIS — Z5181 Encounter for therapeutic drug level monitoring: Secondary | ICD-10-CM | POA: Diagnosis not present

## 2022-08-12 DIAGNOSIS — Z79891 Long term (current) use of opiate analgesic: Secondary | ICD-10-CM | POA: Diagnosis not present

## 2022-09-19 ENCOUNTER — Other Ambulatory Visit (HOSPITAL_COMMUNITY): Payer: Self-pay

## 2022-09-20 ENCOUNTER — Other Ambulatory Visit (HOSPITAL_COMMUNITY): Payer: Self-pay

## 2022-09-20 MED ORDER — LISDEXAMFETAMINE DIMESYLATE 40 MG PO CAPS
40.0000 mg | ORAL_CAPSULE | Freq: Every day | ORAL | 0 refills | Status: DC
  Filled 2022-09-20: qty 30, 30d supply, fill #0

## 2022-09-22 ENCOUNTER — Other Ambulatory Visit (HOSPITAL_COMMUNITY): Payer: Self-pay

## 2022-09-23 ENCOUNTER — Other Ambulatory Visit: Payer: Self-pay | Admitting: Obstetrics and Gynecology

## 2022-09-23 DIAGNOSIS — Z1231 Encounter for screening mammogram for malignant neoplasm of breast: Secondary | ICD-10-CM

## 2022-09-27 ENCOUNTER — Other Ambulatory Visit (HOSPITAL_COMMUNITY): Payer: Self-pay

## 2022-09-30 ENCOUNTER — Other Ambulatory Visit (HOSPITAL_COMMUNITY): Payer: Self-pay

## 2022-09-30 DIAGNOSIS — L578 Other skin changes due to chronic exposure to nonionizing radiation: Secondary | ICD-10-CM | POA: Diagnosis not present

## 2022-09-30 DIAGNOSIS — L814 Other melanin hyperpigmentation: Secondary | ICD-10-CM | POA: Diagnosis not present

## 2022-09-30 DIAGNOSIS — Z411 Encounter for cosmetic surgery: Secondary | ICD-10-CM | POA: Diagnosis not present

## 2022-09-30 DIAGNOSIS — D2271 Melanocytic nevi of right lower limb, including hip: Secondary | ICD-10-CM | POA: Diagnosis not present

## 2022-09-30 MED ORDER — TRETINOIN 0.025 % EX CREA
1.0000 | TOPICAL_CREAM | Freq: Every evening | CUTANEOUS | 3 refills | Status: DC
  Filled 2022-09-30: qty 45, 30d supply, fill #0

## 2022-10-26 ENCOUNTER — Other Ambulatory Visit (HOSPITAL_COMMUNITY): Payer: Self-pay

## 2022-10-26 DIAGNOSIS — Z03818 Encounter for observation for suspected exposure to other biological agents ruled out: Secondary | ICD-10-CM | POA: Diagnosis not present

## 2022-10-26 DIAGNOSIS — J029 Acute pharyngitis, unspecified: Secondary | ICD-10-CM | POA: Diagnosis not present

## 2022-10-26 DIAGNOSIS — R000 Tachycardia, unspecified: Secondary | ICD-10-CM | POA: Diagnosis not present

## 2022-10-26 DIAGNOSIS — R52 Pain, unspecified: Secondary | ICD-10-CM | POA: Diagnosis not present

## 2022-10-26 MED ORDER — LIDOCAINE VISCOUS HCL 2 % MT SOLN
5.0000 mL | OROMUCOSAL | 0 refills | Status: DC | PRN
  Filled 2022-10-26: qty 120, 2d supply, fill #0

## 2022-10-26 MED ORDER — AMOXICILLIN 500 MG PO CAPS
500.0000 mg | ORAL_CAPSULE | Freq: Two times a day (BID) | ORAL | 0 refills | Status: DC
  Filled 2022-10-26: qty 20, 10d supply, fill #0

## 2022-11-01 ENCOUNTER — Other Ambulatory Visit (HOSPITAL_COMMUNITY): Payer: Self-pay

## 2022-11-02 ENCOUNTER — Other Ambulatory Visit (HOSPITAL_COMMUNITY): Payer: Self-pay

## 2022-11-02 MED ORDER — LISDEXAMFETAMINE DIMESYLATE 40 MG PO CAPS
40.0000 mg | ORAL_CAPSULE | Freq: Every day | ORAL | 0 refills | Status: DC
  Filled 2022-11-02: qty 30, 30d supply, fill #0

## 2022-11-04 ENCOUNTER — Other Ambulatory Visit (HOSPITAL_COMMUNITY): Payer: Self-pay

## 2022-11-07 ENCOUNTER — Ambulatory Visit
Admission: RE | Admit: 2022-11-07 | Discharge: 2022-11-07 | Disposition: A | Discharge status: Home / Self Care | Payer: 59 | Source: Ambulatory Visit | Attending: Obstetrics and Gynecology | Admitting: Obstetrics and Gynecology

## 2022-11-07 ENCOUNTER — Ambulatory Visit: Payer: 59

## 2022-11-07 DIAGNOSIS — Z1231 Encounter for screening mammogram for malignant neoplasm of breast: Secondary | ICD-10-CM | POA: Diagnosis not present

## 2022-11-10 ENCOUNTER — Other Ambulatory Visit (HOSPITAL_COMMUNITY): Payer: Self-pay

## 2022-11-11 DIAGNOSIS — N809 Endometriosis, unspecified: Secondary | ICD-10-CM | POA: Diagnosis not present

## 2022-11-11 DIAGNOSIS — F909 Attention-deficit hyperactivity disorder, unspecified type: Secondary | ICD-10-CM | POA: Diagnosis not present

## 2022-11-11 DIAGNOSIS — E7800 Pure hypercholesterolemia, unspecified: Secondary | ICD-10-CM | POA: Diagnosis not present

## 2022-11-11 DIAGNOSIS — Z8249 Family history of ischemic heart disease and other diseases of the circulatory system: Secondary | ICD-10-CM | POA: Diagnosis not present

## 2022-11-11 DIAGNOSIS — J302 Other seasonal allergic rhinitis: Secondary | ICD-10-CM | POA: Diagnosis not present

## 2022-11-11 DIAGNOSIS — Z0000 Encounter for general adult medical examination without abnormal findings: Secondary | ICD-10-CM | POA: Diagnosis not present

## 2022-11-11 DIAGNOSIS — F40243 Fear of flying: Secondary | ICD-10-CM | POA: Diagnosis not present

## 2022-11-11 DIAGNOSIS — E559 Vitamin D deficiency, unspecified: Secondary | ICD-10-CM | POA: Diagnosis not present

## 2022-11-11 DIAGNOSIS — Z131 Encounter for screening for diabetes mellitus: Secondary | ICD-10-CM | POA: Diagnosis not present

## 2022-11-23 DIAGNOSIS — Z79899 Other long term (current) drug therapy: Secondary | ICD-10-CM | POA: Diagnosis not present

## 2022-11-23 DIAGNOSIS — F902 Attention-deficit hyperactivity disorder, combined type: Secondary | ICD-10-CM | POA: Diagnosis not present

## 2022-12-16 ENCOUNTER — Other Ambulatory Visit (HOSPITAL_COMMUNITY): Payer: Self-pay

## 2022-12-21 ENCOUNTER — Other Ambulatory Visit (HOSPITAL_COMMUNITY): Payer: Self-pay

## 2022-12-21 MED ORDER — LISDEXAMFETAMINE DIMESYLATE 40 MG PO CAPS
40.0000 mg | ORAL_CAPSULE | Freq: Every day | ORAL | 0 refills | Status: DC
  Filled 2022-12-21: qty 30, 30d supply, fill #0

## 2022-12-22 ENCOUNTER — Other Ambulatory Visit (HOSPITAL_COMMUNITY): Payer: Self-pay

## 2022-12-23 ENCOUNTER — Other Ambulatory Visit (HOSPITAL_COMMUNITY): Payer: Self-pay

## 2022-12-25 ENCOUNTER — Other Ambulatory Visit (HOSPITAL_COMMUNITY): Payer: Self-pay

## 2022-12-25 MED ORDER — LISDEXAMFETAMINE DIMESYLATE 30 MG PO CAPS
30.0000 mg | ORAL_CAPSULE | Freq: Every day | ORAL | 0 refills | Status: DC
  Filled 2022-12-25: qty 30, 30d supply, fill #0

## 2022-12-26 ENCOUNTER — Other Ambulatory Visit (HOSPITAL_COMMUNITY): Payer: Self-pay

## 2023-02-15 ENCOUNTER — Other Ambulatory Visit (HOSPITAL_COMMUNITY): Payer: Self-pay

## 2023-02-15 MED ORDER — LISDEXAMFETAMINE DIMESYLATE 40 MG PO CAPS
40.0000 mg | ORAL_CAPSULE | Freq: Every day | ORAL | 0 refills | Status: DC
  Filled 2023-02-15: qty 30, 30d supply, fill #0

## 2023-02-21 ENCOUNTER — Other Ambulatory Visit (HOSPITAL_COMMUNITY): Payer: Self-pay

## 2023-03-09 DIAGNOSIS — F902 Attention-deficit hyperactivity disorder, combined type: Secondary | ICD-10-CM | POA: Diagnosis not present

## 2023-03-09 DIAGNOSIS — Z79899 Other long term (current) drug therapy: Secondary | ICD-10-CM | POA: Diagnosis not present

## 2023-03-30 IMAGING — MG MM DIGITAL SCREENING BILAT W/ TOMO AND CAD
8 series · 9 of 24 positions shown · non-contrast
Comparison: None.

CLINICAL DATA: Screening.

EXAM:
DIGITAL SCREENING BILATERAL MAMMOGRAM WITH TOMOSYNTHESIS AND CAD
TECHNIQUE: Bilateral screening digital craniocaudal and mediolateral oblique
mammograms were obtained. Bilateral screening digital breast
tomosynthesis was performed. The images were evaluated with
computer-aided detection.

[R CC synth-2D]
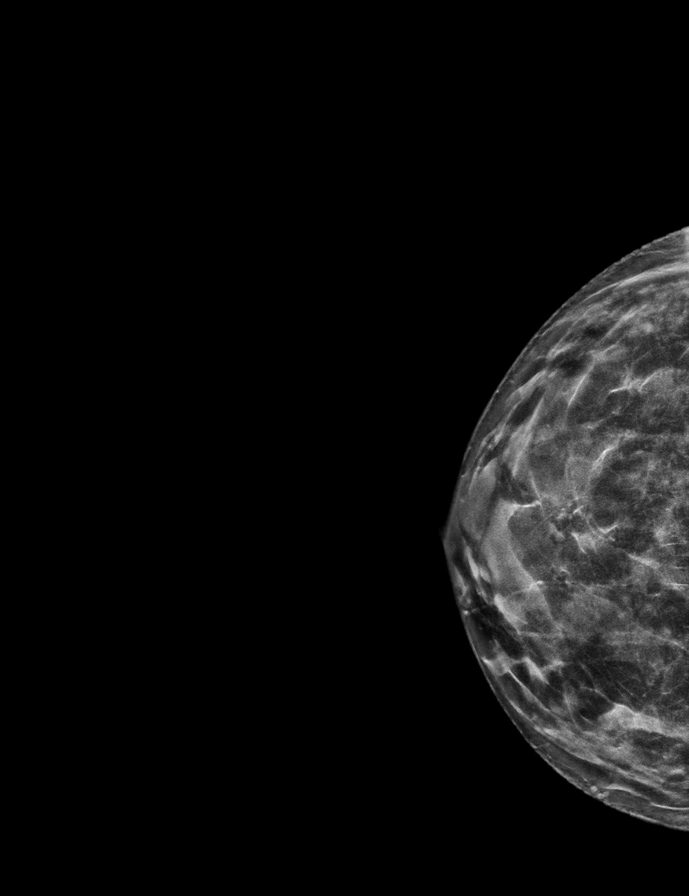

[L MLO synth-2D]
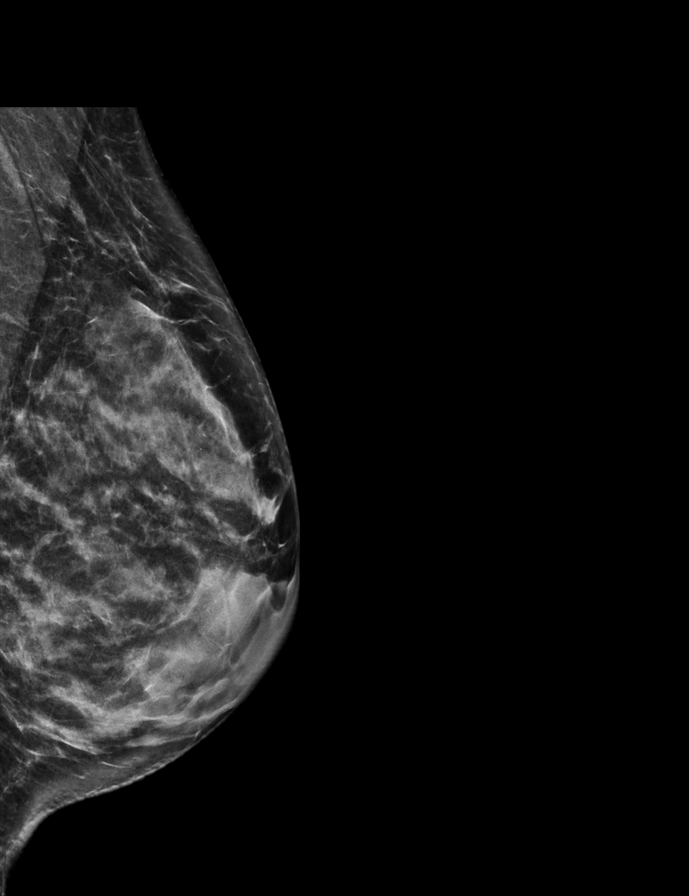

[R MLO synth-2D]
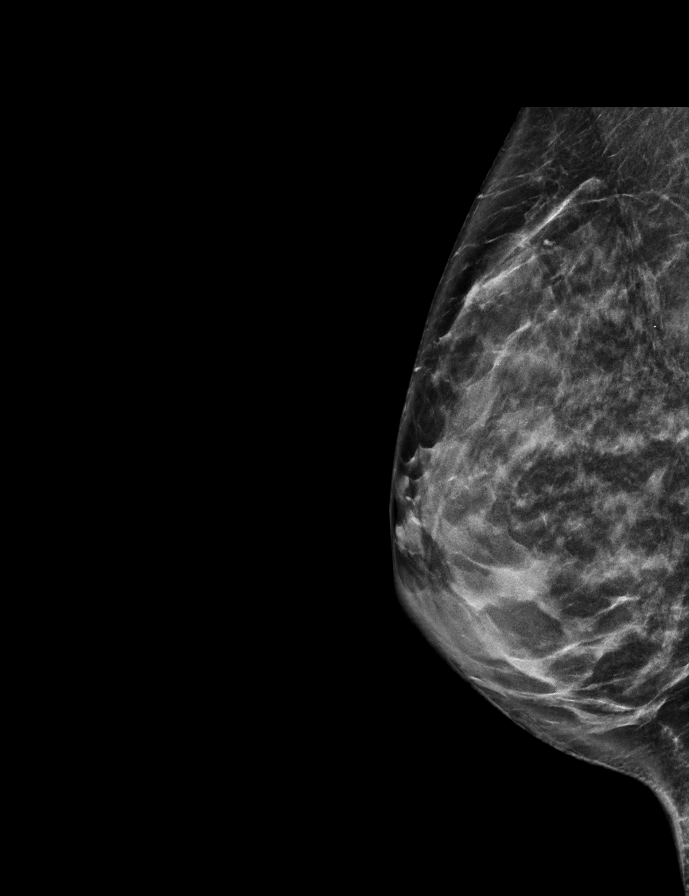

[L CC synth-2D]
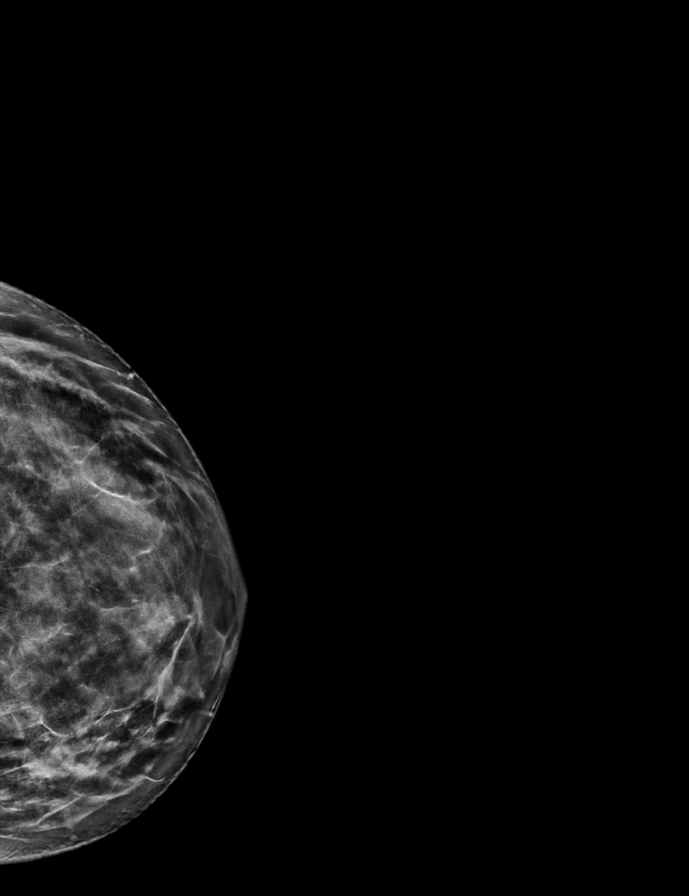

[L MLO tomo · 2 of 62 frames shown]
[frame 21/62]
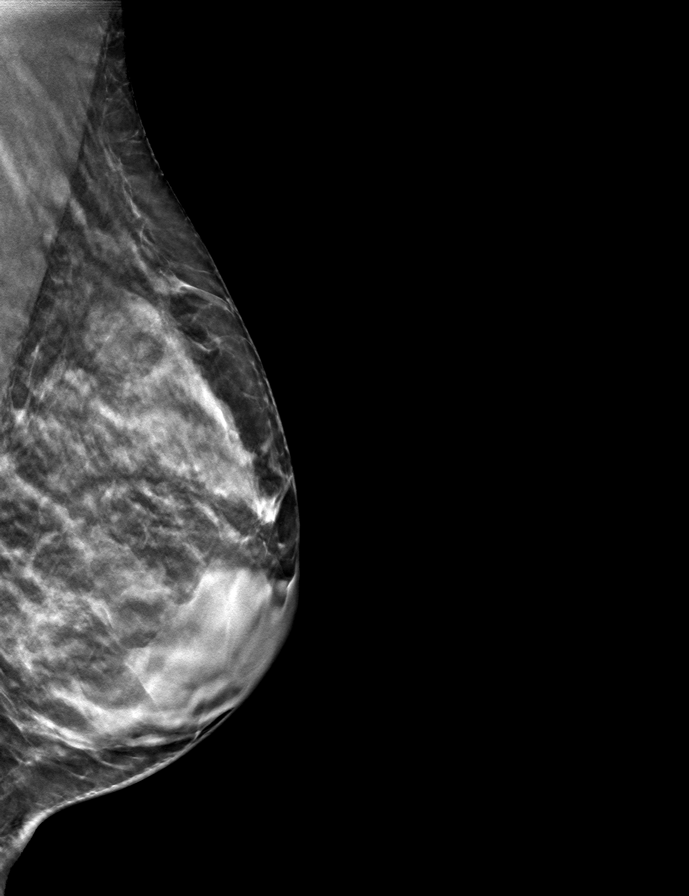
[frame 31/62]
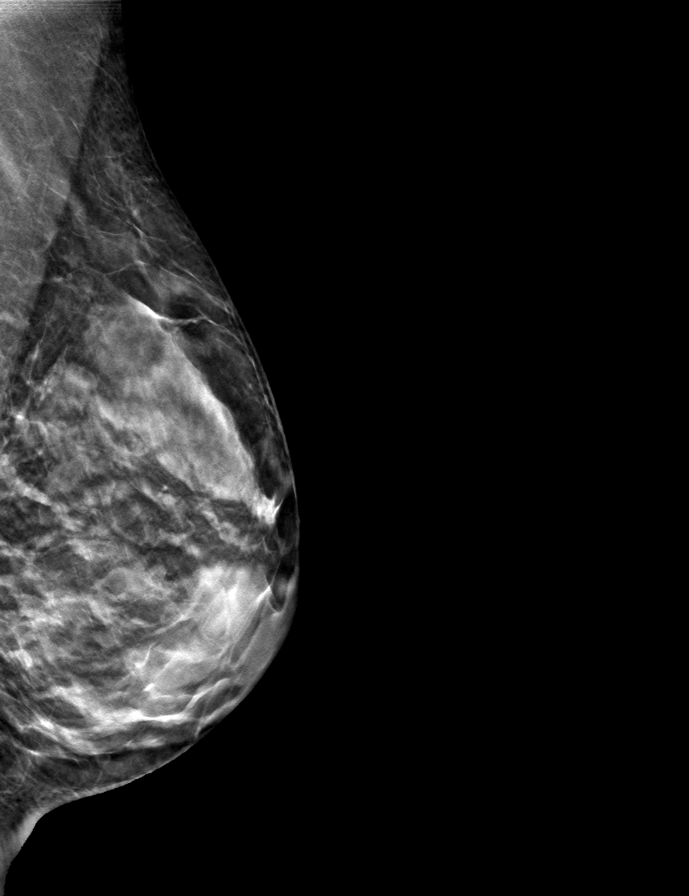

[L CC tomo · tomo slice 32/63.0]
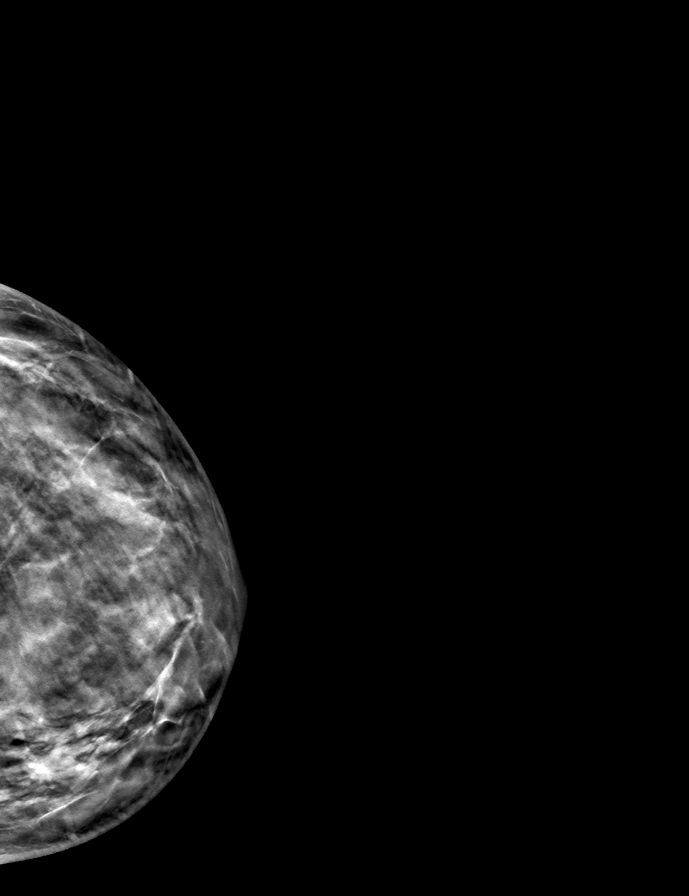

[R MLO tomo · tomo slice 31/62.0]
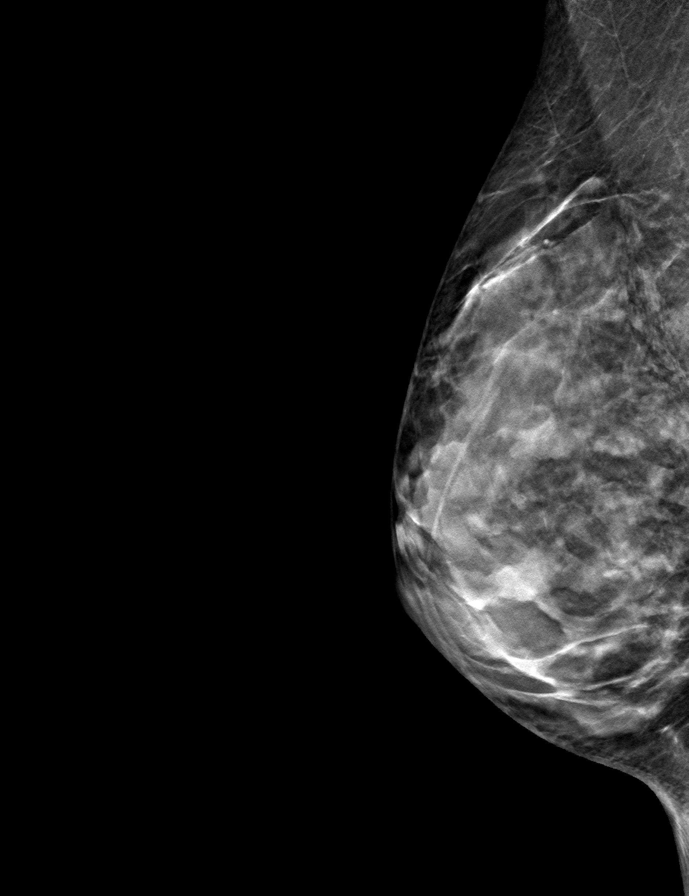

[R CC tomo · tomo slice 30/59.0]
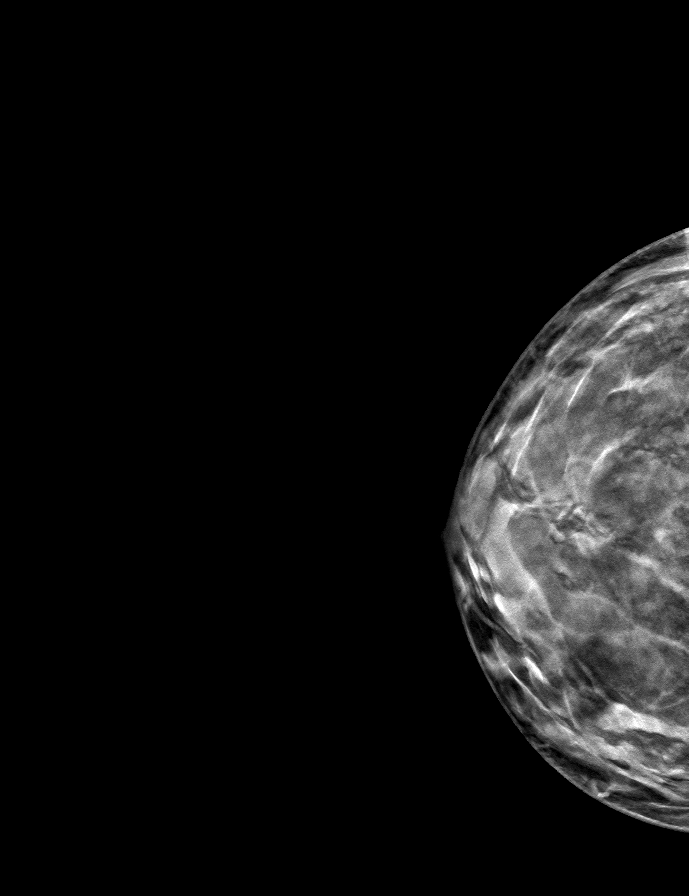

[9 of 24 positions shown; findings below may reference images not displayed]

ACR Breast Density Category d: The breast tissue is extremely dense,
which lowers the sensitivity of mammography.
FINDINGS: There are no findings suspicious for malignancy.
IMPRESSION: No mammographic evidence of malignancy. A result letter of this
screening mammogram will be mailed directly to the patient.

RECOMMENDATION:
Screening mammogram in one year. (Code:W3-Z-XIN)

BI-RADS CATEGORY  1: Negative.

## 2023-03-31 ENCOUNTER — Other Ambulatory Visit (HOSPITAL_COMMUNITY): Payer: Self-pay

## 2023-03-31 MED ORDER — LISDEXAMFETAMINE DIMESYLATE 40 MG PO CAPS
40.0000 mg | ORAL_CAPSULE | Freq: Every day | ORAL | 0 refills | Status: DC
  Filled 2023-03-31: qty 30, 30d supply, fill #0

## 2023-04-25 ENCOUNTER — Other Ambulatory Visit (HOSPITAL_COMMUNITY): Payer: Self-pay

## 2023-04-28 ENCOUNTER — Other Ambulatory Visit (HOSPITAL_COMMUNITY): Payer: Self-pay

## 2023-04-28 MED ORDER — LISDEXAMFETAMINE DIMESYLATE 40 MG PO CAPS
40.0000 mg | ORAL_CAPSULE | Freq: Every day | ORAL | 0 refills | Status: DC
  Filled 2023-04-28: qty 30, 30d supply, fill #0

## 2023-05-02 ENCOUNTER — Other Ambulatory Visit (HOSPITAL_COMMUNITY): Payer: Self-pay

## 2023-05-02 MED ORDER — INFLUENZA VIRUS VACC SPLIT PF (FLUZONE) 0.5 ML IM SUSY
0.5000 mL | PREFILLED_SYRINGE | Freq: Once | INTRAMUSCULAR | 0 refills | Status: AC
  Filled 2023-05-02: qty 0.5, 1d supply, fill #0

## 2023-05-18 ENCOUNTER — Other Ambulatory Visit (HOSPITAL_COMMUNITY): Payer: Self-pay

## 2023-05-18 DIAGNOSIS — L219 Seborrheic dermatitis, unspecified: Secondary | ICD-10-CM | POA: Diagnosis not present

## 2023-05-18 DIAGNOSIS — Z411 Encounter for cosmetic surgery: Secondary | ICD-10-CM | POA: Diagnosis not present

## 2023-05-18 MED ORDER — TRETINOIN 0.05 % EX CREA
1.0000 | TOPICAL_CREAM | Freq: Every evening | CUTANEOUS | 3 refills | Status: AC
  Filled 2023-05-18: qty 45, 30d supply, fill #0
  Filled 2023-11-13: qty 45, 30d supply, fill #1

## 2023-05-18 MED ORDER — KETOCONAZOLE 2 % EX SHAM
1.0000 | MEDICATED_SHAMPOO | CUTANEOUS | 3 refills | Status: DC
  Filled 2023-05-18: qty 120, 30d supply, fill #0

## 2023-05-18 MED ORDER — FLUOCINOLONE ACETONIDE 0.01 % EX SOLN
CUTANEOUS | 2 refills | Status: AC
  Filled 2023-05-18: qty 60, 30d supply, fill #0

## 2023-05-19 ENCOUNTER — Other Ambulatory Visit (HOSPITAL_COMMUNITY): Payer: Self-pay

## 2023-05-26 ENCOUNTER — Other Ambulatory Visit (HOSPITAL_COMMUNITY): Payer: Self-pay

## 2023-05-26 MED ORDER — LISDEXAMFETAMINE DIMESYLATE 40 MG PO CAPS
40.0000 mg | ORAL_CAPSULE | Freq: Every day | ORAL | 0 refills | Status: DC
  Filled 2023-05-26: qty 30, 30d supply, fill #0

## 2023-05-29 ENCOUNTER — Other Ambulatory Visit (HOSPITAL_COMMUNITY): Payer: Self-pay

## 2023-06-16 ENCOUNTER — Other Ambulatory Visit (HOSPITAL_COMMUNITY): Payer: Self-pay

## 2023-06-16 DIAGNOSIS — Z79899 Other long term (current) drug therapy: Secondary | ICD-10-CM | POA: Diagnosis not present

## 2023-06-16 DIAGNOSIS — F902 Attention-deficit hyperactivity disorder, combined type: Secondary | ICD-10-CM | POA: Diagnosis not present

## 2023-06-16 MED ORDER — METHYLPHENIDATE HCL ER (OSM) 36 MG PO TBCR
36.0000 mg | EXTENDED_RELEASE_TABLET | Freq: Every day | ORAL | 0 refills | Status: DC
  Filled 2023-06-16: qty 30, 30d supply, fill #0

## 2023-06-27 NOTE — Progress Notes (Signed)
 GYNECOLOGY  VISIT   HPI: 42 y.o.   Married  Caucasian female   G1P1001 with No LMP recorded. (Menstrual status: IUD).   here for: joint pain and aching around cycle as well as vaginal dryness.  Hx endometriosis and endometriomas.  Has Mirena  IUD which helps her symptoms overall.   Pelvic US  08/04/22 showed normal uterus with IUD in proper position.  Ovaries normal with a follicle of left ovary 1.37 cm.   FSH 8.8 on 07/21/22.   Has achiness in her thighs that occurs monthly.   Has joint pain in hips and knees, that disrupts her sleep.  It comes and goes.   Can wake up sweaty.  Generally cold natured.   Vaginal dryness.  Tried liquid vit E.   Decreased libido.   Notes difficulty maintaining a tampon in vagina.   Has seen pelvic floor therapy.   Vegetarian.  Works out.  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  Has a daughter.  GYNECOLOGIC HISTORY: No LMP recorded. (Menstrual status: IUD). Contraception:  Mirena  IUD, placed 1/22. Menopausal hormone therapy:  n/a Last 2 paps:  06/17/20 neg: HR HPV neg History of abnormal Pap or positive HPV:  no Mammogram:  11/07/22 Breast density cat C, BI-RADS CAT 1 neg       OB History     Gravida  1   Para  1   Term  1   Preterm      AB      Living  1      SAB      IAB      Ectopic      Multiple      Live Births  1              Patient Active Problem List   Diagnosis Date Noted   Lentigo 03/29/2022   Nevus of right thigh 03/29/2022   Sun-damaged skin 03/29/2022   Endometriosis 02/13/2020   IUD (intrauterine device) in place 02/13/2020   TMJ (temporomandibular joint syndrome) 11/15/2019   Chronic hypertension with superimposed preeclampsia 04/25/2014   Hereditary disease in family possibly affecting fetus, affecting management of mother, antepartum condition or complication 11/27/2013   Benign essential hypertension 10/25/2011   Irritable bowel syndrome 10/25/2011   Migraine without aura and without  status migrainosus, not intractable 10/25/2011    Past Medical History:  Diagnosis Date   AMA (advanced maternal age) primigravida 35+    Endometriosis    Hypertension    Newborn product of IVF pregnancy    Postpartum care following vaginal delivery (10/14) 04/24/2014   Postpartum hemorrhage, postpartum condition 04/24/2014    Past Surgical History:  Procedure Laterality Date   ABLATION ON ENDOMETRIOSIS     APPENDECTOMY     DILATION AND EVACUATION N/A 04/23/2014   Procedure: DILATATION AND EVACUATION;  Surgeon: Charlie JINNY Flowers, MD;  Location: [facility];  Service: Gynecology;  Laterality: N/A;   ENDOMETRIAL ABLATION     EXAMINATION UNDER ANESTHESIA N/A 04/23/2014   Procedure: EXAM UNDER ANESTHESIA placement of Bakri Balloon;  Surgeon: Charlie JINNY Flowers, MD;  Location: [facility];  Service: Gynecology;  Laterality: N/A;   mirena      inserted 08-04-20   WISDOM TOOTH EXTRACTION      Current Outpatient Medications  Medication Sig Dispense Refill   ALPRAZolam  (XANAX ) 0.25 MG tablet Take 1 tablet (0.25 mg total) by mouth as needed for flying for 10 days 10 tablet 0   cetirizine (ZYRTEC) 10 MG tablet Take  by mouth.     [START ON 07/13/2023] Estradiol  (VAGIFEM ) 10 MCG TABS vaginal tablet Place 1 tablet (10 mcg total) vaginally 2 (two) times a week. Use every night before bed for two weeks when you first begin this medicine, then after the first two weeks, begin using it twice a week. 34 tablet 1   ketoconazole  (NIZORAL ) 2 % shampoo Apply as directed Externally 2-3 times a week, let sit for 5 mins then rinse. 120 mL 3   levonorgestrel  (MIRENA ) 20 MCG/24HR IUD 1 each by Intrauterine route once.     magic mouthwash (lidocaine , diphenhydrAMINE , alum & mag hydroxide) suspension Gargle with 1-2 teaspoonfuls (5-10 mLs) by mouth every 4 (four) hours as needed. 120 mL 0   methylphenidate  36 MG PO CR tablet Take 1 tablet (36 mg total) by mouth daily. 30 tablet 0   Multiple Vitamin (MULTI VITAMIN) TABS 1  tablet Orally Once a day for 30 day(s)     Sodium Fluoride  1.1 % PSTE Apply  a thin ribbon/pea sized amount to toothbrush, brush teeth thoroughly for at least 2 minutes, use in place of regular toothpaste. Spit, do not swallow. 100 mL 12   tretinoin  (RETIN-A ) 0.025 % cream Apply a pea sized amount topically to face every evening. 45 g 3   tretinoin  (RETIN-A ) 0.05 % cream Apply 1 Application topically every evening, externally once a day, pea size amount for 30 days . 45 g 3   No current facility-administered medications for this visit.     ALLERGIES: Penicillins  Family History  Problem Relation Age of Onset   Cancer Maternal Grandmother 34       breast   Breast cancer Maternal Grandmother 50   Heart attack Maternal Grandfather 45   Diabetes Paternal Grandmother    Diabetes Paternal Grandfather    Cancer Paternal Grandfather        prostate   Hyperlipidemia Father     Social History   Socioeconomic History   Marital status: Married    Spouse name: Not on file   Number of children: Not on file   Years of education: Not on file   Highest education level: Not on file  Occupational History   Not on file  Tobacco Use   Smoking status: Never   Smokeless tobacco: Never  Vaping Use   Vaping status: Never Used  Substance and Sexual Activity   Alcohol use: Yes    Comment: social   Drug use: No   Sexual activity: Yes    Birth control/protection: I.U.D.  Other Topics Concern   Not on file  Social History Narrative   Not on file   Social Drivers of Health   Financial Resource Strain: Not on file  Food Insecurity: Not on file  Transportation Needs: Not on file  Physical Activity: Not on file  Stress: Not on file  Social Connections: Not on file  Intimate Partner Violence: Not on file    Review of Systems  All other systems reviewed and are negative.   PHYSICAL EXAMINATION:   BP 116/72 (BP Location: Left Arm, Patient Position: Sitting, Cuff Size: Small)   Pulse 97    Ht 5' 7.25 (1.708 m)   Wt 129 lb (58.5 kg)   SpO2 91%   BMI 20.05 kg/m     General appearance: alert, cooperative and appears stated age  ASSESSMENT:  Vaginal atrophy. Possible perimenopause.  Mirena  IUD.  Hx endometriosis.   PLAN:  We discussed perimenopausal changes to reproductive organs,  bones, and cardiovascular system.  Treatment option for atrophy reviewed:  cooking oils, vaginal estrogens, and Intrarosa. Patient will start Vagifem .  Instructed in use.  I reviewed potential effect on breast cancer.  Return prn and for annual exam in March, 2025.    25 min  total time was spent for this patient encounter, including preparation, face-to-face counseling with the patient, coordination of care, and documentation of the encounter.

## 2023-07-11 ENCOUNTER — Encounter: Payer: Self-pay | Admitting: Obstetrics and Gynecology

## 2023-07-11 ENCOUNTER — Ambulatory Visit (INDEPENDENT_AMBULATORY_CARE_PROVIDER_SITE_OTHER): Payer: 59 | Admitting: Obstetrics and Gynecology

## 2023-07-11 ENCOUNTER — Other Ambulatory Visit (HOSPITAL_COMMUNITY): Payer: Self-pay

## 2023-07-11 VITALS — BP 116/72 | HR 97 | Ht 67.25 in | Wt 129.0 lb

## 2023-07-11 DIAGNOSIS — N952 Postmenopausal atrophic vaginitis: Secondary | ICD-10-CM | POA: Diagnosis not present

## 2023-07-11 MED ORDER — ESTRADIOL 10 MCG VA TABS
1.0000 | ORAL_TABLET | VAGINAL | 1 refills | Status: DC
  Filled 2023-07-11: qty 32, 74d supply, fill #0

## 2023-07-11 NOTE — Patient Instructions (Signed)
 Estradiol  Vaginal insert What is this medication? ESTRADIOL  (es tra DYE ole) relieves the symptoms of menopause, such as vaginal irritation, dryness, or pain during sex. It works by increasing levels of the hormone estrogen in the body. It is an estrogen hormone. This medicine may be used for other purposes; ask your health care provider or pharmacist if you have questions. COMMON BRAND NAME(S): Imvexxy , Vagifem , Yuvafem  What should I tell my care team before I take this medication? They need to know if you have any of these conditions: Asthma Blood clotting disorder or history of blood clots Cancer, such as breast, cervical, or liver cancer Diabetes Gallbladder disease Having surgery Heart or blood vessel conditions Hereditary angioedema, a genetic condition that causes episodes of severe swelling High blood pressure High cholesterol High levels of calcium in your blood History of heart attack History of stroke Hysterectomy Kidney disease Liver disease Lupus Migraine or other severe headaches Porphyria Seizures Thyroid disease Tobacco use Unusual vaginal bleeding An unusual or allergic reaction to estrogens, other medications, foods, dyes, or preservatives Pregnant or trying to get pregnant Breastfeeding How should I use this medication? This medication is for vaginal use only. Do not take by mouth. Follow the directions that are included with your prescription. Wash hands before and after use. Keep using this medication unless your care team tells you to stop. This medication comes with INSTRUCTIONS FOR USE. Ask your pharmacist for directions on how to use this medication. Read the information carefully. Talk to your pharmacist or care team if you have questions. A patient package insert for the product will be given with each prescription and refill. Read this sheet carefully each time. The sheet may change frequently. Contact your care team about the use of this medication in  children. Special care may be needed. Overdosage: If you think you have taken too much of this medicine contact a poison control center or emergency room at once. NOTE: This medicine is only for you. Do not share this medicine with others. What if I miss a dose? If you miss a dose, use it as soon as you can. If it is almost time for your next dose, use only that dose. Do not use double or extra doses. What may interact with this medication? This medication may affect how other medications work, and other medications may affect the way this medication works. Talk with your care team about all of the medications you take. They may suggest changes to your treatment plan to lower the risk of side effects and to make sure your medications work as intended. This list may not describe all possible interactions. Give your health care provider a list of all the medicines, herbs, non-prescription drugs, or dietary supplements you use. Also tell them if you smoke, drink alcohol, or use illegal drugs. Some items may interact with your medicine. What should I watch for while using this medication? Visit your care team for regular checks on your progress. Tell your care team if your symptoms do not start to get better or if they get worse. Talk to your care team about how often you should have a pelvic exam, breast exam, and a mammogram. Talk to your care team about your risk of cancer. You may be more at risk for certain types of cancer if you take this medication. Talk to your care team right away if you have vaginal bleeding while on this medication. If you have a uterus, talk to your care team about whether  adding a progestin to your hormone therapy is right for you. Taking progestins with estrogen therapy may lower the risk of uterine cancer, but can have other health risks. Talk to your care team if you use tobacco products. Changes to your treatment plan may be needed. Tobacco increases the risk of getting a  blood clot or having a stroke while you are taking this medication. This risk is higher if you are 35 years or older. Tell your care team right away if you have any change in your eyesight. If you are going to need surgery or other procedure, tell your care team that you are using this medication. If you may be pregnant, stop taking this medication right away and contact your care team. What side effects may I notice from receiving this medication? Side effects that you should report to your care team as soon as possible: Allergic reactions or angioedema--skin rash, itching or hives, swelling of the face, eyes, lips, tongue, arms, or legs, trouble swallowing or breathing Blood clot--pain, swelling, or warmth in the leg, shortness of breath, chest pain Breast tissue changes, new lumps, redness, pain, or discharge from the nipple Change in vision Gallbladder problems--severe stomach pain, nausea, vomiting, fever Increase in blood pressure Liver injury--right upper belly pain, loss of appetite, nausea, light-colored stool, dark yellow or brown urine, yellowing skin or eyes, unusual weakness or fatigue Stroke--sudden numbness or weakness of the face, arm, or leg, trouble speaking, confusion, trouble walking, loss of balance or coordination, dizziness, severe headache, change in vision Unusual vaginal discharge, itching, or odor Vaginal bleeding after menopause, pelvic pain Side effects that usually do not require medical attention (report these to your care team if they continue or are bothersome): Bloating Breast pain or tenderness Hair loss Nausea Stomach pain Swelling of the ankles, hands, or feet Vaginal irritation at application site This list may not describe all possible side effects. Call your doctor for medical advice about side effects. You may report side effects to FDA at 1-800-FDA-1088. Where should I keep my medication? Keep out of the reach of children and pets. Store at room  temperature between 20 and 25 degrees C (68 and 77 degrees F). Get rid of any unused medication after the expiration date. To get rid of medications that are no longer needed or have expired: Take the medication to a medication take-back program. Check with your pharmacy or law enforcement to find a location. If you cannot return the medication, ask your pharmacist or care team how to get rid of this medication safely. NOTE: This sheet is a summary. It may not cover all possible information. If you have questions about this medicine, talk to your doctor, pharmacist, or health care provider.  2024 Elsevier/Gold Standard (2022-07-19 00:00:00)   Atrophic Vaginitis  Atrophic vaginitis is a condition in which the tissues that line the vagina become dry and thin. This condition is most common in women who have stopped having regular menstrual periods (are in menopause). This usually starts when a woman is 59 to 44 years old. That is the time when a woman's estrogen levels begin to decrease. Estrogen is a female hormone. It helps to keep the tissues of the vagina moist. It stimulates the vagina to produce a clear fluid that lubricates the vagina for sex. This fluid also protects the vagina from infection. Lack of estrogen can cause the lining of the vagina to get thinner and dryer. The vagina may also shrink in size. It may become less  elastic. Atrophic vaginitis tends to get worse over time as a woman's estrogen level drops. What are the causes? This condition is caused by the normal drop in estrogen that happens around the time of menopause. What increases the risk? Certain conditions or situations may lower a woman's estrogen level, leading to a higher risk for atrophic vaginitis. You are more likely to develop this condition if: You are taking medicines that block estrogen. You have had your ovaries removed. You are being treated for cancer with radiation or medicines (chemotherapy). You have given  birth or are breastfeeding. You are older than age 34. You smoke. What are the signs or symptoms? Symptoms of this condition include: Pain, soreness, a feeling of pressure, or bleeding during sex (dyspareunia). Vaginal burning, irritation, or itching. Pain or bleeding when a speculum is used in a vaginal exam. Having burning pain while urinating. Vaginal discharge. In some cases, there are no symptoms. How is this diagnosed? This condition is diagnosed based on your medical history and a physical exam. This will include a pelvic exam that checks the vaginal tissues. Though rare, you may also have other tests, including: A urine test. A test that checks the acid balance in your vagina (acid balance test). How is this treated? Treatment for this condition depends on how severe your symptoms are. Treatment may include: Using an over-the-counter vaginal lubricant before sex. Using a long-acting vaginal moisturizer. Using low-dose estrogen for moderate to severe symptoms that do not respond to other treatments. Options include creams, tablets, and inserts (vaginal rings). Before you use a vaginal estrogen, tell your health care provider if you have a history of: Breast cancer. Endometrial cancer. Blood clots. If you are not sexually active and your symptoms are very mild, you may not need treatment. Follow these instructions at home: Medicines Take over-the-counter and prescription medicines only as told by your health care provider. Do not use herbal or alternative medicines unless your health care provider says that you can. Use over-the-counter creams, lubricants, or moisturizers for dryness only as told by your health care provider. General instructions If your atrophic vaginitis is caused by menopause, discuss all of your menopause symptoms and treatment options with your health care provider. Do not douche. Do not use products that can make your vagina dry. These include: Scented  feminine sprays. Scented tampons. Scented soaps. Vaginal sex can help to improve blood flow and elasticity of vaginal tissue. If you choose to have sex and it hurts, try using a water-soluble lubricant or moisturizer right before having sex. Contact a health care provider if: Your discharge looks different than normal. Your vagina has an unusual smell. You have new symptoms. Your symptoms do not improve with treatment. Your symptoms get worse. Summary Atrophic vaginitis is a condition in which the tissues that line the vagina become dry and thin. It is most common in women who have stopped having regular menstrual periods (are in menopause). Treatment options include using vaginal lubricants and low-dose vaginal estrogen. Contact a health care provider if your vagina has an unusual smell, or if your symptoms get worse or do not improve after treatment. This information is not intended to replace advice given to you by your health care provider. Make sure you discuss any questions you have with your health care provider. Document Revised: 12/26/2019 Document Reviewed: 12/26/2019 Elsevier Patient Education  2024 [employer].

## 2023-07-19 ENCOUNTER — Other Ambulatory Visit (HOSPITAL_COMMUNITY): Payer: Self-pay

## 2023-07-26 ENCOUNTER — Other Ambulatory Visit: Payer: Self-pay | Admitting: Medical Genetics

## 2023-07-27 ENCOUNTER — Other Ambulatory Visit (HOSPITAL_COMMUNITY): Payer: Self-pay

## 2023-07-27 MED ORDER — METHYLPHENIDATE HCL ER (OSM) 54 MG PO TBCR
54.0000 mg | EXTENDED_RELEASE_TABLET | Freq: Every day | ORAL | 0 refills | Status: DC
  Filled 2023-07-27: qty 30, 30d supply, fill #0

## 2023-08-22 ENCOUNTER — Other Ambulatory Visit (HOSPITAL_COMMUNITY): Payer: Self-pay

## 2023-09-07 NOTE — Progress Notes (Unsigned)
 43 y.o. G27P1001 [name] here for annual exam. Pt reports ~1 a month, she experiences some achiness in hips/thighs. Inquiring if it is related to period even though no bleeding w/ IUD.  Takes Aleve.    Normal pelvic US 08/04/22.    Has done pelvic floor therapy.   Using Vagifem and thinks it is helping.  Using twice a week.   Notices that her hands and feet get purple and white.   PCP: Ollen Bowl, MD   No LMP recorded. (Menstrual status: IUD).           Sexually active: Yes.    The current method of family planning is IUD.   Mirena placed 1/22. Menopausal hormone therapy:  n/a Exercising: Yes.     Yoga, weights, cardio ~4d a week. Smoker:  no  OB History  Gravida Para Term Preterm AB Living  1 1 1   1   SAB IAB Ectopic Multiple Live Births      1    # Outcome Date GA Lbr Len/2nd Weight Sex Type Anes PTL Lv  1 Term 04/23/14 [redacted]w[redacted]d 08:19 / 01:00 6 lb 7 oz (2.92 kg) F Vag-Spont EPI  LIV     Birth Comments: [phone]     HEALTH MAINTENANCE: Last 2 paps:  06/17/20 neg: HR HPV neg History of abnormal Pap or positive HPV:  no Mammogram: 11/07/22 Breast Density Cat C, BI-RADS CAT 1 neg Colonoscopy:  n/a Bone Density:  n/a  Result  n/a   Immunization History  Administered Date(s) Administered   Influenza Split 04/15/2013, 05/13/2014, 04/20/2015, 04/29/2016, 04/12/2017   Influenza, Seasonal, Injecte, Preservative Fre 05/02/2023   Influenza-Unspecified 05/13/2014, 04/20/2015, 04/29/2016, 04/12/2017, 04/12/2021, 04/02/2022   PFIZER(Purple Top)SARS-COV-2 Vaccination 05/21/2020   Td (Adult),5 Lf Tetanus Toxid, Preservative Free 07/11/2006   Tdap 07/11/2006, 03/06/2014      reports that she has never smoked. She has never used smokeless tobacco. She reports current alcohol use. She reports that she does not use drugs.  Past Medical History:  Diagnosis Date   AMA (advanced maternal age) primigravida 57+    Endometriosis    Hypertension    Newborn product of  IVF pregnancy    Postpartum care following vaginal delivery (10/14) 04/24/2014   Postpartum hemorrhage, postpartum condition 04/24/2014    Past Surgical History:  Procedure Laterality Date   ABLATION ON ENDOMETRIOSIS     APPENDECTOMY     DILATION AND EVACUATION N/A 04/23/2014   Procedure: DILATATION AND EVACUATION;  Surgeon: Lenoard Aden, MD;  Location: [facility];  Service: Gynecology;  Laterality: N/A;   ENDOMETRIAL ABLATION     EXAMINATION UNDER ANESTHESIA N/A 04/23/2014   Procedure: EXAM UNDER ANESTHESIA placement of Bakri Balloon;  Surgeon: Lenoard Aden, MD;  Location: [facility];  Service: Gynecology;  Laterality: N/A;   mirena     inserted 08-04-20   WISDOM TOOTH EXTRACTION      Current Outpatient Medications  Medication Sig Dispense Refill   ALPRAZolam (XANAX) 0.25 MG tablet Take 1 tablet (0.25 mg total) by mouth as needed for flying for 10 days 10 tablet 0   cetirizine (ZYRTEC) 10 MG tablet Take by mouth.     Estradiol (VAGIFEM) 10 MCG TABS vaginal tablet Place 1 tablet (10 mcg total) vaginally 2 (two) times a week. Use every night before bed for two weeks when you first begin this medicine, then after the first two weeks, begin using it twice a week. 34 tablet 1   levonorgestrel (  MIRENA) 20 MCG/24HR IUD 1 each by Intrauterine route once.     methylphenidate 36 MG PO CR tablet Take 1 tablet (36 mg total) by mouth daily. 30 tablet 0   Multiple Vitamin (MULTI VITAMIN) TABS 1 tablet Orally Once a day for 30 day(s)     tretinoin (RETIN-A) 0.025 % cream Apply a pea sized amount topically to face every evening. 45 g 3   tretinoin (RETIN-A) 0.05 % cream Apply 1 Application topically every evening, externally once a day, pea size amount for 30 days . 45 g 3   ketoconazole (NIZORAL) 2 % shampoo Apply as directed Externally 2-3 times a week, let sit for 5 mins then rinse. (Patient not taking: Reported on 09/21/2023) 120 mL 3   Methylphenidate HCl ER, OSM, (RELEXXII) 72 MG TBCR Take 1  tablet (72 mg total) by mouth daily. (Patient not taking: Reported on 09/21/2023) 30 tablet 0   No current facility-administered medications for this visit.    ALLERGIES: Penicillins  Family History  Problem Relation Age of Onset   Cancer Maternal Grandmother 37       breast   Breast cancer Maternal Grandmother 50   Heart attack Maternal Grandfather 45   Diabetes Paternal Grandmother    Diabetes Paternal Grandfather    Cancer Paternal Grandfather        prostate   Hyperlipidemia Father     Review of Systems  All other systems reviewed and are negative.   PHYSICAL EXAM:  BP 112/72   Pulse 68   Ht 5' 7.75" (1.721 m)   Wt 124 lb (56.2 kg)   SpO2 98%   BMI 18.99 kg/m     General appearance: alert, cooperative and appears stated age Head: normocephalic, without obvious abnormality, atraumatic Neck: no adenopathy, supple, symmetrical, trachea midline and thyroid normal to inspection and palpation Lungs: clear to auscultation bilaterally Breasts: normal appearance, no masses or tenderness, No nipple retraction or dimpling, No nipple discharge or bleeding, No axillary adenopathy Heart: regular rate and rhythm Abdomen: soft, non-tender; no masses, no organomegaly Extremities: extremities normal, atraumatic, no cyanosis or edema Skin: skin color, texture, turgor normal. No rashes or lesions Lymph nodes: cervical, supraclavicular, and axillary nodes normal. Neurologic: grossly normal  Pelvic: External genitalia:  no lesions              No abnormal inguinal nodes palpated.              Urethra:  normal appearing urethra with no masses, tenderness or lesions              Bartholins and Skenes: normal                 Vagina: normal appearing vagina with normal color and discharge, no lesions              Cervix: no lesions.  IUD strings noted.              Pap taken: no Bimanual Exam:  Uterus:  normal size, contour, position, consistency, mobility, non-tender               Adnexa: no mass, fullness, tenderness              Rectal exam: yes.  Confirms.              Anus:  normal sphincter tone, no lesions  Chaperone was present for exam:  Rosette Reveal, CMA  ASSESSMENT: Well woman visit with gynecologic exam. Mirena  IUD.  Hx endometriosis and endometriomas. PHQ-9: 0 Probable Raynaud's. Vaginal atrophy.  ***  PLAN: Mammogram screening discussed. Self breast awareness reviewed. Pap and HRV collected:  no Guidelines for Calcium, Vitamin D, regular exercise program including cardiovascular and weight bearing exercise. Medication refills:  Vagifem.  I discussed potential effect on breast caner.  Colon cancer screening discussed starting age 43.  Labs with PCP.  Follow up:  yearly and prn.

## 2023-09-14 ENCOUNTER — Other Ambulatory Visit (HOSPITAL_COMMUNITY): Payer: Self-pay

## 2023-09-14 DIAGNOSIS — Z79899 Other long term (current) drug therapy: Secondary | ICD-10-CM | POA: Diagnosis not present

## 2023-09-14 DIAGNOSIS — F902 Attention-deficit hyperactivity disorder, combined type: Secondary | ICD-10-CM | POA: Diagnosis not present

## 2023-09-14 MED ORDER — METHYLPHENIDATE HCL ER (OSM) 72 MG PO TBCR
72.0000 mg | EXTENDED_RELEASE_TABLET | Freq: Every day | ORAL | 0 refills | Status: DC
  Filled 2023-09-14 – 2023-09-19 (×2): qty 30, 30d supply, fill #0

## 2023-09-14 MED ORDER — ALPRAZOLAM 0.25 MG PO TABS
0.2500 mg | ORAL_TABLET | Freq: Every day | ORAL | 0 refills | Status: DC | PRN
  Filled 2023-09-14: qty 10, 10d supply, fill #0

## 2023-09-19 ENCOUNTER — Other Ambulatory Visit (HOSPITAL_COMMUNITY): Payer: Self-pay

## 2023-09-21 ENCOUNTER — Other Ambulatory Visit (HOSPITAL_COMMUNITY): Payer: Self-pay

## 2023-09-21 ENCOUNTER — Ambulatory Visit: Payer: 59 | Admitting: Obstetrics and Gynecology

## 2023-09-21 ENCOUNTER — Encounter: Payer: Self-pay | Admitting: Obstetrics and Gynecology

## 2023-09-21 VITALS — BP 112/72 | HR 68 | Ht 67.75 in | Wt 124.0 lb

## 2023-09-21 DIAGNOSIS — Z1331 Encounter for screening for depression: Secondary | ICD-10-CM

## 2023-09-21 DIAGNOSIS — Z01419 Encounter for gynecological examination (general) (routine) without abnormal findings: Secondary | ICD-10-CM

## 2023-09-21 DIAGNOSIS — N952 Postmenopausal atrophic vaginitis: Secondary | ICD-10-CM | POA: Diagnosis not present

## 2023-09-21 DIAGNOSIS — Z5181 Encounter for therapeutic drug level monitoring: Secondary | ICD-10-CM | POA: Diagnosis not present

## 2023-09-21 MED ORDER — METHYLPHENIDATE HCL ER (OSM) 36 MG PO TBCR
72.0000 mg | EXTENDED_RELEASE_TABLET | Freq: Every day | ORAL | 0 refills | Status: DC
  Filled 2023-09-21: qty 60, 30d supply, fill #0

## 2023-09-21 MED ORDER — ESTRADIOL 10 MCG VA TABS
1.0000 | ORAL_TABLET | VAGINAL | 3 refills | Status: AC
  Filled 2023-09-21: qty 24, 84d supply, fill #0

## 2023-09-21 NOTE — Patient Instructions (Addendum)
 Raynaud's Phenomenon  Raynaud's phenomenon is a condition that affects the blood vessels (arteries) that carry blood to the fingers and toes. The arteries that supply blood to the ears, lips, nipples, or the tip of the nose might also be affected. Raynaud's phenomenon causes the arteries to become narrow temporarily (spasm). As a result, the flow of blood to the affected areas is temporarily decreased. This usually occurs in response to cold temperatures or stress. During an attack, the skin in the affected areas turns white, then blue, and finally red. A person may also feel tingling or numbness in those areas. Attacks usually last for only a brief period, and then the blood flow to the area returns to normal. In most cases, Raynaud's phenomenon does not cause serious health problems. What are the causes? In many cases, the cause of this condition is not known. The condition may occur on its own (primary Raynaud's phenomenon) or may be associated with other diseases or factors (secondary Raynaud's phenomenon). Possible causes may include: Diseases or medical conditions that damage the arteries. Injuries and repetitive actions that hurt the hands or feet. Being exposed to certain chemicals. Taking medicines that narrow the arteries. Other medical conditions, such as lupus, scleroderma, rheumatoid arthritis, thyroid problems, blood disorders, Sjogren syndrome, or atherosclerosis. What increases the risk? The following factors may make you more likely to develop this condition: Being 70-72 years old. Being female. Having a family history of Raynaud's phenomenon. Living in a cold climate. Smoking. What are the signs or symptoms? Symptoms of this condition usually occur when you are exposed to cold temperatures or when you have emotional stress. The symptoms may last for a few minutes or up to several hours. They usually affect your fingers but may also affect your toes, nipples, lips, ears, or the  tip of your nose. Symptoms may include: Changes in skin color. The skin in the affected areas will turn pale or white. The skin may then change from white to bluish to red as normal blood flow returns to the area. Numbness, tingling, or pain in the affected areas. In severe cases, symptoms may include: Skin sores. Tissues decaying and dying (gangrene). How is this diagnosed? This condition may be diagnosed based on: Your symptoms and medical history. A physical exam. During the exam, you may be asked to put your hands in cold water to check for a reaction to cold temperature. Tests, such as: Blood tests to check for other diseases or conditions. A test to check the movement of blood through your arteries and veins (vascular ultrasound). A test in which the skin at the base of your fingernail is examined under a microscope (nailfold capillaroscopy). How is this treated? During an episode, you can take actions to help symptoms go away faster. Options include moving your arms around in a windmill pattern, warming your fingers under warm water, or placing your fingers in a warm body fold, such as your armpit. Long-term treatment for this condition often involves making lifestyle changes and taking steps to control your exposure to cold temperature. For more severe cases, medicine (calcium channel blockers) may be used to improve blood circulation. Follow these instructions at home: Avoiding cold temperatures Take these steps to avoid exposure to cold: If possible, stay indoors during cold weather. When you go outside during cold weather, dress in layers and wear mittens, a hat, a scarf, and warm footwear. Wear mittens or gloves when handling ice or frozen food. Use holders for glasses or cans containing  cold drinks. Let warm water run for a while before taking a shower or bath. Warm up the car before driving in cold weather. Lifestyle If possible, avoid stressful and emotional situations. Try  to find ways to manage your stress, such as: Exercise. Yoga. Meditation. Biofeedback. Do not use any products that contain nicotine or tobacco. These products include cigarettes, chewing tobacco, and vaping devices, such as e-cigarettes. If you need help quitting, ask your health care provider. Avoid secondhand smoke. Limit your use of caffeine. Switch to decaffeinated coffee, tea, and soda. Avoid chocolate. Avoid vibrating tools and machinery. General instructions Protect your hands and feet from injuries, cuts, or bruises. Avoid wearing tight rings or wristbands. Wear loose fitting socks and comfortable, roomy shoes. Take over-the-counter and prescription medicines only as told by your health care provider. Where to find support Raynaud's Association: www.raynauds.org Where to find more information [employer] of Arthritis and Musculoskeletal and Skin Diseases: www.niams.http://www.myers.net/ Contact a health care provider if: Your discomfort becomes worse despite lifestyle changes. You develop sores on your fingers or toes that do not heal. You have breaks in the skin on your fingers or toes. You have a fever. You have pain or swelling in your joints. You have a rash. Your symptoms occur on only one side of your body. Get help right away if: Your fingers or toes turn black. You have severe pain in the affected areas. These symptoms may represent a serious problem that is an emergency. Do not wait to see if the symptoms will go away. Get medical help right away. Call your local emergency services (911 in the [place] Do not drive yourself to the hospital. Summary Raynaud's phenomenon is a condition that affects the arteries that carry blood to the fingers, toes, ears, lips, nipples, or the tip of the nose. In many cases, the cause of this condition is not known. Symptoms of this condition include changes in skin color along with numbness and tingling in the affected area. Treatment for  this condition includes lifestyle changes and reducing exposure to cold temperatures. Medicines may be used for severe cases of the condition. Contact your health care provider if your condition worsens despite treatment. This information is not intended to replace advice given to you by your health care provider. Make sure you discuss any questions you have with your health care provider. Document Revised: 09/01/2020 Document Reviewed: 09/01/2020 Elsevier Patient Education  2024 [employer] Inc.  EXERCISE AND DIET:  We recommended that you start or continue a regular exercise program for good health. Regular exercise means any activity that makes your heart beat faster and makes you sweat.  We recommend exercising at least 30 minutes per day at least 3 days a week, preferably 4 or 5.  We also recommend a diet low in fat and sugar.  Inactivity, poor dietary choices and obesity can cause diabetes, heart attack, stroke, and kidney damage, among others.    ALCOHOL AND SMOKING:  Women should limit their alcohol intake to no more than 7 drinks/beers/glasses of wine (combined, not each!) per week. Moderation of alcohol intake to this level decreases your risk of breast cancer and liver damage. And of course, no recreational drugs are part of a healthy lifestyle.  And absolutely no smoking or even second hand smoke. Most people know smoking can cause heart and lung diseases, but did you know it also contributes to weakening of your bones? Aging of your skin?  Yellowing of your teeth and nails?  CALCIUM  AND VITAMIN D:  Adequate intake of calcium and Vitamin D are recommended.  The recommendations for exact amounts of these supplements seem to change often, but generally speaking 600 mg of calcium (either carbonate or citrate) and 800 units of Vitamin D per day seems prudent. Certain women may benefit from higher intake of Vitamin D.  If you are among these women, your doctor will have told you during your visit.     PAP SMEARS:  Pap smears, to check for cervical cancer or precancers,  have traditionally been done yearly, although recent scientific advances have shown that most women can have pap smears less often.  However, every woman still should have a physical exam from her gynecologist every year. It will include a breast check, inspection of the vulva and vagina to check for abnormal growths or skin changes, a visual exam of the cervix, and then an exam to evaluate the size and shape of the uterus and ovaries.  And after 45 years of age, a rectal exam is indicated to check for rectal cancers. We will also provide age appropriate advice regarding health maintenance, like when you should have certain vaccines, screening for sexually transmitted diseases, bone density testing, colonoscopy, mammograms, etc.   MAMMOGRAMS:  All women over 23 years old should have a yearly mammogram. Many facilities now offer a "3D" mammogram, which may cost around $50 extra out of pocket. If possible,  we recommend you accept the option to have the 3D mammogram performed.  It both reduces the number of women who will be called back for extra views which then turn out to be normal, and it is better than the routine mammogram at detecting truly abnormal areas.    COLONOSCOPY:  Colonoscopy to screen for colon cancer is recommended for all women at age 46.  We know, you hate the idea of the prep.  We agree, BUT, having colon cancer and not knowing it is worse!!  Colon cancer so often starts as a polyp that can be seen and removed at colonscopy, which can quite literally save your life!  And if your first colonoscopy is normal and you have no family history of colon cancer, most women don't have to have it again for 10 years.  Once every ten years, you can do something that may end up saving your life, right?  We will be happy to help you get it scheduled when you are ready.  Be sure to check your insurance coverage so you understand how  much it will cost.  It may be covered as a preventative service at no cost, but you should check your particular policy.

## 2023-09-26 ENCOUNTER — Other Ambulatory Visit (HOSPITAL_COMMUNITY): Payer: Self-pay

## 2023-10-12 ENCOUNTER — Other Ambulatory Visit (HOSPITAL_COMMUNITY): Payer: Self-pay

## 2023-10-12 DIAGNOSIS — D2261 Melanocytic nevi of right upper limb, including shoulder: Secondary | ICD-10-CM | POA: Diagnosis not present

## 2023-10-12 DIAGNOSIS — L814 Other melanin hyperpigmentation: Secondary | ICD-10-CM | POA: Diagnosis not present

## 2023-10-12 DIAGNOSIS — L578 Other skin changes due to chronic exposure to nonionizing radiation: Secondary | ICD-10-CM | POA: Diagnosis not present

## 2023-10-12 DIAGNOSIS — D2271 Melanocytic nevi of right lower limb, including hip: Secondary | ICD-10-CM | POA: Diagnosis not present

## 2023-10-12 DIAGNOSIS — Z411 Encounter for cosmetic surgery: Secondary | ICD-10-CM | POA: Diagnosis not present

## 2023-10-12 DIAGNOSIS — L219 Seborrheic dermatitis, unspecified: Secondary | ICD-10-CM | POA: Diagnosis not present

## 2023-10-12 MED ORDER — FLUOCINOLONE ACETONIDE 0.01 % EX SOLN
1.0000 | Freq: Every day | CUTANEOUS | 2 refills | Status: DC
  Filled 2023-10-12: qty 60, 30d supply, fill #0

## 2023-10-13 ENCOUNTER — Other Ambulatory Visit (HOSPITAL_COMMUNITY): Payer: Self-pay

## 2023-10-20 ENCOUNTER — Other Ambulatory Visit: Payer: Self-pay | Admitting: Internal Medicine

## 2023-10-20 DIAGNOSIS — Z1231 Encounter for screening mammogram for malignant neoplasm of breast: Secondary | ICD-10-CM

## 2023-11-10 ENCOUNTER — Ambulatory Visit
Admission: RE | Admit: 2023-11-10 | Discharge: 2023-11-10 | Disposition: A | Discharge status: Home / Self Care | Source: Ambulatory Visit | Attending: Internal Medicine

## 2023-11-10 DIAGNOSIS — Z1231 Encounter for screening mammogram for malignant neoplasm of breast: Secondary | ICD-10-CM

## 2023-11-13 ENCOUNTER — Other Ambulatory Visit: Payer: Self-pay

## 2023-11-13 ENCOUNTER — Other Ambulatory Visit (HOSPITAL_COMMUNITY): Payer: Self-pay

## 2023-11-14 ENCOUNTER — Other Ambulatory Visit (HOSPITAL_COMMUNITY): Payer: Self-pay

## 2023-11-14 MED ORDER — METHYLPHENIDATE HCL ER (OSM) 36 MG PO TBCR
72.0000 mg | EXTENDED_RELEASE_TABLET | Freq: Every day | ORAL | 0 refills | Status: DC
  Filled 2023-11-14: qty 60, 30d supply, fill #0

## 2023-11-17 DIAGNOSIS — E7800 Pure hypercholesterolemia, unspecified: Secondary | ICD-10-CM | POA: Diagnosis not present

## 2023-11-17 DIAGNOSIS — F40243 Fear of flying: Secondary | ICD-10-CM | POA: Diagnosis not present

## 2023-11-17 DIAGNOSIS — E559 Vitamin D deficiency, unspecified: Secondary | ICD-10-CM | POA: Diagnosis not present

## 2023-11-17 DIAGNOSIS — N809 Endometriosis, unspecified: Secondary | ICD-10-CM | POA: Diagnosis not present

## 2023-11-17 DIAGNOSIS — F909 Attention-deficit hyperactivity disorder, unspecified type: Secondary | ICD-10-CM | POA: Diagnosis not present

## 2023-11-17 DIAGNOSIS — Z0000 Encounter for general adult medical examination without abnormal findings: Secondary | ICD-10-CM | POA: Diagnosis not present

## 2023-11-17 DIAGNOSIS — Z8249 Family history of ischemic heart disease and other diseases of the circulatory system: Secondary | ICD-10-CM | POA: Diagnosis not present

## 2023-11-17 DIAGNOSIS — J302 Other seasonal allergic rhinitis: Secondary | ICD-10-CM | POA: Diagnosis not present

## 2023-12-08 ENCOUNTER — Other Ambulatory Visit (HOSPITAL_COMMUNITY): Payer: Self-pay

## 2023-12-08 MED ORDER — LISDEXAMFETAMINE DIMESYLATE 40 MG PO CAPS
40.0000 mg | ORAL_CAPSULE | Freq: Every day | ORAL | 0 refills | Status: DC
  Filled 2023-12-08: qty 30, 30d supply, fill #0

## 2024-01-25 ENCOUNTER — Other Ambulatory Visit (HOSPITAL_COMMUNITY): Payer: Self-pay

## 2024-01-25 DIAGNOSIS — Z79899 Other long term (current) drug therapy: Secondary | ICD-10-CM | POA: Diagnosis not present

## 2024-01-25 DIAGNOSIS — F902 Attention-deficit hyperactivity disorder, combined type: Secondary | ICD-10-CM | POA: Diagnosis not present

## 2024-01-25 MED ORDER — METHYLPHENIDATE HCL ER (OSM) 36 MG PO TBCR
72.0000 mg | EXTENDED_RELEASE_TABLET | Freq: Every day | ORAL | 0 refills | Status: AC
  Filled 2024-01-25 – 2024-02-13 (×2): qty 60, 30d supply, fill #0

## 2024-01-25 MED ORDER — LISDEXAMFETAMINE DIMESYLATE 40 MG PO CAPS
40.0000 mg | ORAL_CAPSULE | Freq: Every day | ORAL | 0 refills | Status: DC
  Filled 2024-01-25 – 2024-02-13 (×2): qty 30, 30d supply, fill #0

## 2024-02-06 ENCOUNTER — Other Ambulatory Visit (HOSPITAL_COMMUNITY): Payer: Self-pay

## 2024-02-13 ENCOUNTER — Other Ambulatory Visit: Payer: Self-pay

## 2024-02-13 ENCOUNTER — Other Ambulatory Visit (HOSPITAL_COMMUNITY): Payer: Self-pay

## 2024-03-14 ENCOUNTER — Other Ambulatory Visit (HOSPITAL_COMMUNITY): Payer: Self-pay

## 2024-03-14 MED ORDER — ALPRAZOLAM 0.25 MG PO TABS
0.2500 mg | ORAL_TABLET | Freq: Every day | ORAL | 0 refills | Status: AC | PRN
  Filled 2024-03-14 – 2024-05-10 (×2): qty 10, 10d supply, fill #0

## 2024-03-25 ENCOUNTER — Other Ambulatory Visit (HOSPITAL_COMMUNITY): Payer: Self-pay

## 2024-04-04 ENCOUNTER — Encounter: Payer: Self-pay | Admitting: Gastroenterology

## 2024-04-08 ENCOUNTER — Other Ambulatory Visit (HOSPITAL_COMMUNITY): Payer: Self-pay

## 2024-05-02 ENCOUNTER — Ambulatory Visit (AMBULATORY_SURGERY_CENTER)

## 2024-05-02 ENCOUNTER — Other Ambulatory Visit (HOSPITAL_COMMUNITY): Payer: Self-pay

## 2024-05-02 VITALS — Ht 67.5 in | Wt 125.0 lb

## 2024-05-02 DIAGNOSIS — Z1211 Encounter for screening for malignant neoplasm of colon: Secondary | ICD-10-CM

## 2024-05-02 MED ORDER — LISDEXAMFETAMINE DIMESYLATE 40 MG PO CAPS
40.0000 mg | ORAL_CAPSULE | Freq: Every day | ORAL | 0 refills | Status: DC
  Filled 2024-05-02: qty 30, 30d supply, fill #0

## 2024-05-02 MED ORDER — NA SULFATE-K SULFATE-MG SULF 17.5-3.13-1.6 GM/177ML PO SOLN
1.0000 | Freq: Once | ORAL | 0 refills | Status: AC
  Filled 2024-05-02: qty 354, 1d supply, fill #0

## 2024-05-02 NOTE — Progress Notes (Signed)
 Pre visit completed via phone call; Patient verified name, DOB, and address; No egg or soy allergy known to patient;  No issues known to pt with past sedation with any surgeries or procedures; Patient denies ever being told they had issues or difficulty with intubation;  No FH of Malignant Hyperthermia; Pt is not on diet pills; Pt is not on home 02;  Pt is not on blood thinners;  Pt denies issues with constipation -more of the opposite in the past No A fib or A flutter; Have any cardiac testing pending--No Insurance verified during PV appt--- Anna Washington Pt can ambulate without assistance;  Pt denies use of chewing tobacco Discussed diabetic/weight loss medication holds; Discussed NSAID holds; Checked BMI to be less than 50; Pt instructed to use Singlecare.com or GoodRx for a price reduction on prep;  Patient's chart reviewed by Norleen Schillings CNRA prior to previsit and patient appropriate for the [facility];  Pre visit completed and red dot placed by patient's name on their procedure day (on provider's schedule); Instructions sent to MyChart per patient request;

## 2024-05-09 DIAGNOSIS — N951 Menopausal and female climacteric states: Secondary | ICD-10-CM | POA: Diagnosis not present

## 2024-05-09 DIAGNOSIS — Z01419 Encounter for gynecological examination (general) (routine) without abnormal findings: Secondary | ICD-10-CM | POA: Diagnosis not present

## 2024-05-10 ENCOUNTER — Other Ambulatory Visit (HOSPITAL_COMMUNITY): Payer: Self-pay

## 2024-05-13 ENCOUNTER — Encounter: Payer: Self-pay | Admitting: Gastroenterology

## 2024-05-16 ENCOUNTER — Encounter: Payer: Self-pay | Admitting: Gastroenterology

## 2024-05-16 ENCOUNTER — Ambulatory Visit: Admitting: Gastroenterology

## 2024-05-16 VITALS — BP 119/75 | HR 73 | Temp 97.2°F | Resp 16 | Ht 67.5 in | Wt 125.0 lb

## 2024-05-16 DIAGNOSIS — Z83719 Family history of colon polyps, unspecified: Secondary | ICD-10-CM

## 2024-05-16 DIAGNOSIS — Z1211 Encounter for screening for malignant neoplasm of colon: Secondary | ICD-10-CM

## 2024-05-16 DIAGNOSIS — K5730 Diverticulosis of large intestine without perforation or abscess without bleeding: Secondary | ICD-10-CM | POA: Diagnosis not present

## 2024-05-16 DIAGNOSIS — Z800 Family history of malignant neoplasm of digestive organs: Secondary | ICD-10-CM

## 2024-05-16 DIAGNOSIS — I10 Essential (primary) hypertension: Secondary | ICD-10-CM | POA: Diagnosis not present

## 2024-05-16 MED ORDER — SODIUM CHLORIDE 0.9 % IV SOLN: 500.0000 mL | Freq: Once | INTRAVENOUS | Status: DC

## 2024-05-16 NOTE — Progress Notes (Signed)
 GASTROENTEROLOGY PROCEDURE H&P NOTE   Primary Care Physician: Vernon Velna SAUNDERS, MD    Reason for Procedure:  Colon Cancer screening  Plan:    Colonoscopy  Patient is appropriate for endoscopic procedure(s) in the ambulatory ([facility]) setting.  The nature of the procedure, as well as the risks, benefits, and alternatives were carefully and thoroughly reviewed with the patient. Ample time for discussion and questions allowed. The patient understood, was satisfied, and agreed to proceed. I personally addressed all patient questions and concerns.     HPI: Anna Washington is a 43 y.o. female who presents for colonoscopy for routine Colon Cancer screening.  Paternal grandfather with colon cancer, and father with colon polyps. Patient is otherwise without complaints or active issues today.  Past Medical History:  Diagnosis Date   AMA (advanced maternal age) primigravida 37+    Endometriosis    Hypertension    Newborn product of IVF pregnancy    Postpartum care following vaginal delivery (10/14) 04/24/2014   Postpartum hemorrhage, postpartum condition 04/24/2014    Past Surgical History:  Procedure Laterality Date   ABLATION ON ENDOMETRIOSIS     APPENDECTOMY     DILATION AND EVACUATION N/A 04/23/2014   Procedure: DILATATION AND EVACUATION;  Surgeon: Charlie JINNY Flowers, MD;  Location: [facility];  Service: Gynecology;  Laterality: N/A;   ENDOMETRIAL ABLATION     EXAMINATION UNDER ANESTHESIA N/A 04/23/2014   Procedure: EXAM UNDER ANESTHESIA placement of Bakri Balloon;  Surgeon: Charlie JINNY Flowers, MD;  Location: [facility];  Service: Gynecology;  Laterality: N/A;   mirena      inserted 08-04-20   WISDOM TOOTH EXTRACTION      Prior to Admission medications   Medication Sig Start Date End Date Taking? Authorizing Provider  cetirizine (ZYRTEC) 10 MG tablet Take by mouth. Patient taking differently: Take 10 mg by mouth daily.   Yes [provider]  Cholecalciferol 50 MCG (2000 UT) TABS  Take 1 tablet by mouth daily as needed.   Yes [provider]  levonorgestrel  (MIRENA ) 20 MCG/24HR IUD 1 each by Intrauterine route once.   Yes [provider]  lisdexamfetamine (VYVANSE ) 40 MG capsule Take 1 capsule (40 mg total) by mouth daily as directed. 05/02/24  Yes   Multiple Vitamin (MULTI VITAMIN) TABS 1 tablet Orally Once a day for 30 day(s) Patient taking differently: Take 1 tablet by mouth daily at 6 (six) AM.   Yes [provider]  SUNSCREEN SPF30 EX Apply 1 Application topically daily at 6 (six) AM.   Yes [provider]  tretinoin  (RETIN-A ) 0.05 % cream Apply 1 Application topically every evening, externally once a day, pea size amount for 30 days . 05/18/23  Yes   ALPRAZolam  (XANAX ) 0.25 MG tablet Take 1 tablet (0.25 mg total) by mouth daily as needed for flying. 03/13/24     Estradiol  (VAGIFEM ) 10 MCG TABS vaginal tablet Place 1 tablet (10 mcg total) vaginally 2 (two) times a week. 09/21/23   Amundson C Silva, Brook E, MD  ibuprofen  (ADVIL ) 200 MG tablet Take 200 mg by mouth every 6 (six) hours as needed for mild pain (pain score 1-3) or headache.    [provider]  methylphenidate  (CONCERTA ) 36 MG PO CR tablet Take 2 tablets (72 mg total) by mouth daily as directed. 01/25/24       Current Outpatient Medications  Medication Sig Dispense Refill   cetirizine (ZYRTEC) 10 MG tablet Take by mouth. (Patient taking differently: Take 10 mg  by mouth daily.)     Cholecalciferol 50 MCG (2000 UT) TABS Take 1 tablet by mouth daily as needed.     levonorgestrel  (MIRENA ) 20 MCG/24HR IUD 1 each by Intrauterine route once.     lisdexamfetamine (VYVANSE ) 40 MG capsule Take 1 capsule (40 mg total) by mouth daily as directed. 30 capsule 0   Multiple Vitamin (MULTI VITAMIN) TABS 1 tablet Orally Once a day for 30 day(s) (Patient taking differently: Take 1 tablet by mouth daily at 6 (six) AM.)     SUNSCREEN SPF30 EX Apply 1 Application topically daily at 6 (six)  AM.     tretinoin  (RETIN-A ) 0.05 % cream Apply 1 Application topically every evening, externally once a day, pea size amount for 30 days . 45 g 3   ALPRAZolam  (XANAX ) 0.25 MG tablet Take 1 tablet (0.25 mg total) by mouth daily as needed for flying. 10 tablet 0   Estradiol  (VAGIFEM ) 10 MCG TABS vaginal tablet Place 1 tablet (10 mcg total) vaginally 2 (two) times a week. 24 tablet 3   ibuprofen  (ADVIL ) 200 MG tablet Take 200 mg by mouth every 6 (six) hours as needed for mild pain (pain score 1-3) or headache.     methylphenidate  (CONCERTA ) 36 MG PO CR tablet Take 2 tablets (72 mg total) by mouth daily as directed. 60 tablet 0   Current Facility-Administered Medications  Medication Dose Route Frequency Provider Last Rate Last Admin   0.9 %  sodium chloride  infusion  500 mL Intravenous Once Ranny Wiebelhaus V, DO        Allergies as of 05/16/2024 - Review Complete 05/16/2024  Allergen Reaction Noted   Penicillins Nausea Only 11/27/2013    Family History  Problem Relation Age of Onset   Colon polyps Father    Hyperlipidemia Father    Cancer Maternal Grandmother 64       breast cancer 2nd time   Breast cancer Maternal Grandmother 60   Heart attack Maternal Grandfather 45   Diabetes Paternal Grandmother    Prostate cancer Paternal Grandfather 32   Colon polyps Paternal Grandfather 35   Colon cancer Paternal Grandfather 31   Diabetes Paternal Grandfather    Esophageal cancer Neg Hx    Rectal cancer Neg Hx    Stomach cancer Neg Hx     Social History   Socioeconomic History   Marital status: Married    Spouse name: Not on file   Number of children: Not on file   Years of education: Not on file   Highest education level: Not on file  Occupational History   Not on file  Tobacco Use   Smoking status: Never   Smokeless tobacco: Never  Vaping Use   Vaping status: Never Used  Substance and Sexual Activity   Alcohol use: Yes    Alcohol/week: 3.0 standard drinks of alcohol     Types: 3 Standard drinks or equivalent per week    Comment: social   Drug use: No   Sexual activity: Yes    Partners: Male    Birth control/protection: I.U.D.  Other Topics Concern   Not on file  Social History Narrative   Not on file   Social Drivers of Health   Financial Resource Strain: Not on file  Food Insecurity: Not on file  Transportation Needs: Not on file  Physical Activity: Not on file  Stress: Not on file  Social Connections: Not on file  Intimate Partner Violence: Not on file    Physical  Exam: Vital signs in last 24 hours: @BP  126/82   Pulse 84   Temp (!) 97.2 F (36.2 C)   Ht 5' 7.5 (1.715 m)   Wt 125 lb (56.7 kg)   SpO2 100%   BMI 19.29 kg/m  GEN: NAD EYE: Sclerae anicteric ENT: MMM CV: Non-tachycardic Pulm: CTA b/l GI: Soft, NT/ND NEURO:  Alert & Oriented x 3   Sandor Flatter, DO [facility]   05/16/2024 8:50 AM

## 2024-05-16 NOTE — Patient Instructions (Signed)
 Handouts given: Diverticulosis Resume previous diet. Continue present medications.  Repeat colonoscopy in 10 years for screening purposes. Return to GI office PRN.  YOU HAD AN ENDOSCOPIC PROCEDURE TODAY AT THE [facility]:   Refer to the procedure report that was given to you for any specific questions about what was found during the examination.  If the procedure report does not answer your questions, please call your gastroenterologist to clarify.  If you requested that your care partner not be given the details of your procedure findings, then the procedure report has been included in a sealed envelope for you to review at your convenience later.  YOU SHOULD EXPECT: Some feelings of bloating in the abdomen. Passage of more gas than usual.  Walking can help get rid of the air that was put into your GI tract during the procedure and reduce the bloating. If you had a lower endoscopy (such as a colonoscopy or flexible sigmoidoscopy) you may notice spotting of blood in your stool or on the toilet paper. If you underwent a bowel prep for your procedure, you may not have a normal bowel movement for a few days.  Please Note:  You might notice some irritation and congestion in your nose or some drainage.  This is from the oxygen used during your procedure.  There is no need for concern and it should clear up in a day or so.  SYMPTOMS TO REPORT IMMEDIATELY:  Following lower endoscopy (colonoscopy or flexible sigmoidoscopy):  Excessive amounts of blood in the stool  Significant tenderness or worsening of abdominal pains  Swelling of the abdomen that is new, acute  Fever of 100F or higher   For urgent or emergent issues, a gastroenterologist can be reached at any hour by calling [phone]. Do not use MyChart messaging for urgent concerns.    DIET:  We do recommend a small meal at first, but then you may proceed to your regular diet.  Drink plenty of fluids but you should avoid  alcoholic beverages for 24 hours.  ACTIVITY:  You should plan to take it easy for the rest of today and you should NOT DRIVE or use heavy machinery until tomorrow (because of the sedation medicines used during the test).    FOLLOW UP: Our staff will call the number listed on your records the next business day following your procedure.  We will call around 7:15- 8:00 am to check on you and address any questions or concerns that you may have regarding the information given to you following your procedure. If we do not reach you, we will leave a message.     If any biopsies were taken you will be contacted by phone or by letter within the next 1-3 weeks.  Please call us  at [phone] if you have not heard about the biopsies in 3 weeks.    SIGNATURES/CONFIDENTIALITY: You and/or your care partner have signed paperwork which will be entered into your electronic medical record.  These signatures attest to the fact that that the information above on your After Visit Summary has been reviewed and is understood.  Full responsibility of the confidentiality of this discharge information lies with you and/or your care-partner.

## 2024-05-16 NOTE — Op Note (Signed)
 [facility] Patient Name: Anna Washington Procedure Date: 05/16/2024 8:43 AM MRN: [id number] Endoscopist: Sandor Flatter , MD, [id number] Age: 43 Referring MD:  Date of Birth: 29-Apr-1981 Gender: Female Account #: [id number] Procedure:                Colonoscopy Indications:              Screening for colorectal malignant neoplasm, This                            is the patient's first colonoscopy.                           Paternal grandfather with colon cancer, and father                            with colon polyps. Medicines:                Monitored Anesthesia Care Procedure:                Pre-Anesthesia Assessment:                           - Prior to the procedure, a History and Physical                            was performed, and patient medications and                            allergies were reviewed. The patient's tolerance of                            previous anesthesia was also reviewed. The risks                            and benefits of the procedure and the sedation                            options and risks were discussed with the patient.                            All questions were answered, and informed consent                            was obtained. Prior Anticoagulants: The patient has                            taken no anticoagulant or antiplatelet agents. ASA                            Grade Assessment: II - A patient with mild systemic                            disease. After reviewing the risks and benefits,  the patient was deemed in satisfactory condition to                            undergo the procedure.                           After obtaining informed consent, the colonoscope                            was passed under direct vision. Throughout the                            procedure, the patient's blood pressure, pulse, and                            oxygen saturations were monitored continuously. The                             CF [id number] #[id number] was introduced through the anus                            and advanced to the the cecum, identified by                            appendiceal orifice and ileocecal valve. The                            colonoscopy was performed without difficulty. The                            patient tolerated the procedure well. The quality                            of the bowel preparation was good. The ileocecal                            valve, appendiceal orifice, and rectum were                            photographed. Scope In: 8:56:08 AM Scope Out: 9:10:59 AM Scope Withdrawal Time: 0 hours 9 minutes 10 seconds  Total Procedure Duration: 0 hours 14 minutes 51 seconds  Findings:                 The perianal and digital rectal examinations were                            normal.                           A few small-mouthed diverticula were found in the                            sigmoid colon.  The exam was otherwise normal throughout the                            remainder of the colon.                           The retroflexed view of the distal rectum and anal                            verge was normal and showed no anal or rectal                            abnormalities. Complications:            No immediate complications. Estimated Blood Loss:     Estimated blood loss: none. Impression:               - Mild diverticulosis in the sigmoid colon.                           - The distal rectum and anal verge are normal on                            retroflexion view.                           - No specimens collected. Recommendation:           - Patient has a contact number available for                            emergencies. The signs and symptoms of potential                            delayed complications were discussed with the                            patient. Return to normal activities tomorrow.                             Written discharge instructions were provided to the                            patient.                           - Resume previous diet.                           - Continue present medications.                           - Repeat colonoscopy in 10 years for screening                            purposes.                           -  Return to GI office PRN. Sandor Flatter, MD 05/16/2024 9:17:05 AM

## 2024-05-16 NOTE — Progress Notes (Signed)
 Transferred to PACU via stretcher.  Not responding to stimulation at this time.  VSS upon leaving procedure room.

## 2024-05-16 NOTE — Progress Notes (Signed)
 VS by CL  Pt's states no medical or surgical changes since previsit or office visit.

## 2024-05-17 ENCOUNTER — Telehealth: Payer: Self-pay

## 2024-05-17 ENCOUNTER — Other Ambulatory Visit (HOSPITAL_COMMUNITY): Payer: Self-pay

## 2024-05-17 MED ORDER — ESTRADIOL 0.025 MG/24HR TD PTTW
1.0000 | MEDICATED_PATCH | TRANSDERMAL | 3 refills | Status: AC
  Filled 2024-05-17: qty 24, 84d supply, fill #0
  Filled 2024-07-10: qty 24, 84d supply, fill #1
  Filled 2024-07-22: qty 24, 84d supply, fill #0

## 2024-05-17 NOTE — Telephone Encounter (Signed)
  Follow up Call-     05/16/2024    7:47 AM  Call back number  Post procedure Call Back phone  # [phone]  Permission to leave phone message Yes    Follow up call, LVM

## 2024-06-28 ENCOUNTER — Other Ambulatory Visit (HOSPITAL_COMMUNITY): Payer: Self-pay

## 2024-06-28 MED ORDER — LISDEXAMFETAMINE DIMESYLATE 40 MG PO CAPS
40.0000 mg | ORAL_CAPSULE | Freq: Every day | ORAL | 0 refills | Status: AC
  Filled 2024-06-28 – 2024-07-15 (×2): qty 30, 30d supply, fill #0

## 2024-07-02 ENCOUNTER — Other Ambulatory Visit (HOSPITAL_COMMUNITY): Payer: Self-pay

## 2024-07-10 ENCOUNTER — Other Ambulatory Visit (HOSPITAL_COMMUNITY): Payer: Self-pay

## 2024-07-12 ENCOUNTER — Other Ambulatory Visit (HOSPITAL_COMMUNITY): Payer: Self-pay

## 2024-07-15 ENCOUNTER — Other Ambulatory Visit (HOSPITAL_COMMUNITY): Payer: Self-pay

## 2024-08-05 ENCOUNTER — Other Ambulatory Visit (HOSPITAL_COMMUNITY): Payer: Self-pay

## 2024-09-26 ENCOUNTER — Ambulatory Visit: Admitting: Obstetrics and Gynecology
